# Patient Record
Sex: Female | Born: 1970 | Race: Black or African American | Hispanic: No | Marital: Married | Smoking: Never smoker
Health system: Southern US, Community
[De-identification: ages and names within clinical notes are randomized; demographics above are authoritative.]

## PROBLEM LIST (undated history)

## (undated) DIAGNOSIS — E0590 Thyrotoxicosis, unspecified without thyrotoxic crisis or storm: Secondary | ICD-10-CM

## (undated) HISTORY — DX: Thyrotoxicosis, unspecified without thyrotoxic crisis or storm: E05.90

---

## 1997-09-17 ENCOUNTER — Other Ambulatory Visit: Admission: RE | Admit: 1997-09-17 | Discharge: 1997-09-17 | Payer: Self-pay | Admitting: Nephrology

## 1998-01-06 ENCOUNTER — Other Ambulatory Visit: Admission: RE | Admit: 1998-01-06 | Discharge: 1998-01-06 | Payer: Self-pay | Admitting: Obstetrics and Gynecology

## 1998-05-14 ENCOUNTER — Other Ambulatory Visit: Admission: RE | Admit: 1998-05-14 | Discharge: 1998-05-14 | Payer: Self-pay | Admitting: Obstetrics and Gynecology

## 1999-09-07 ENCOUNTER — Other Ambulatory Visit: Admission: RE | Admit: 1999-09-07 | Discharge: 1999-09-07 | Payer: Self-pay | Admitting: Obstetrics and Gynecology

## 1999-12-08 ENCOUNTER — Inpatient Hospital Stay (HOSPITAL_COMMUNITY): Admission: AD | Admit: 1999-12-08 | Discharge: 1999-12-08 | Payer: Self-pay | Admitting: Obstetrics and Gynecology

## 1999-12-21 ENCOUNTER — Inpatient Hospital Stay (HOSPITAL_COMMUNITY): Admission: AD | Admit: 1999-12-21 | Discharge: 1999-12-22 | Payer: Self-pay | Admitting: Obstetrics & Gynecology

## 2000-06-13 ENCOUNTER — Encounter: Payer: Self-pay | Admitting: Obstetrics and Gynecology

## 2000-06-13 ENCOUNTER — Ambulatory Visit (HOSPITAL_COMMUNITY): Admission: RE | Admit: 2000-06-13 | Discharge: 2000-06-13 | Payer: Self-pay | Admitting: Obstetrics and Gynecology

## 2000-06-30 ENCOUNTER — Inpatient Hospital Stay (HOSPITAL_COMMUNITY): Admission: AD | Admit: 2000-06-30 | Discharge: 2000-07-03 | Payer: Self-pay | Admitting: Obstetrics and Gynecology

## 2000-07-06 ENCOUNTER — Observation Stay (HOSPITAL_COMMUNITY): Admission: AD | Admit: 2000-07-06 | Discharge: 2000-07-07 | Payer: Self-pay | Admitting: Obstetrics and Gynecology

## 2000-08-31 ENCOUNTER — Other Ambulatory Visit: Admission: RE | Admit: 2000-08-31 | Discharge: 2000-08-31 | Payer: Self-pay | Admitting: Obstetrics and Gynecology

## 2001-11-19 ENCOUNTER — Other Ambulatory Visit: Admission: RE | Admit: 2001-11-19 | Discharge: 2001-11-19 | Payer: Self-pay | Admitting: Obstetrics and Gynecology

## 2003-05-09 ENCOUNTER — Encounter: Admission: RE | Admit: 2003-05-09 | Discharge: 2003-05-09 | Payer: Self-pay | Admitting: Emergency Medicine

## 2003-08-29 ENCOUNTER — Emergency Department (HOSPITAL_COMMUNITY): Admission: EM | Admit: 2003-08-29 | Discharge: 2003-08-29 | Payer: Self-pay | Admitting: Emergency Medicine

## 2004-02-16 ENCOUNTER — Emergency Department (HOSPITAL_COMMUNITY): Admission: EM | Admit: 2004-02-16 | Discharge: 2004-02-16 | Payer: Self-pay | Admitting: Family Medicine

## 2005-03-31 ENCOUNTER — Emergency Department (HOSPITAL_COMMUNITY): Admission: EM | Admit: 2005-03-31 | Discharge: 2005-03-31 | Payer: Self-pay | Admitting: Family Medicine

## 2005-04-03 ENCOUNTER — Emergency Department (HOSPITAL_COMMUNITY): Admission: AD | Admit: 2005-04-03 | Discharge: 2005-04-03 | Payer: Self-pay | Admitting: Family Medicine

## 2005-04-05 ENCOUNTER — Emergency Department (HOSPITAL_COMMUNITY): Admission: EM | Admit: 2005-04-05 | Discharge: 2005-04-05 | Payer: Self-pay | Admitting: *Deleted

## 2005-09-08 ENCOUNTER — Emergency Department (HOSPITAL_COMMUNITY): Admission: EM | Admit: 2005-09-08 | Discharge: 2005-09-08 | Payer: Self-pay | Admitting: Emergency Medicine

## 2007-04-12 ENCOUNTER — Emergency Department (HOSPITAL_COMMUNITY): Admission: EM | Admit: 2007-04-12 | Discharge: 2007-04-12 | Payer: Self-pay | Admitting: Emergency Medicine

## 2008-03-24 ENCOUNTER — Emergency Department (HOSPITAL_COMMUNITY): Admission: EM | Admit: 2008-03-24 | Discharge: 2008-03-24 | Payer: Self-pay | Admitting: Family Medicine

## 2008-07-28 ENCOUNTER — Ambulatory Visit (HOSPITAL_COMMUNITY): Admission: RE | Admit: 2008-07-28 | Discharge: 2008-07-28 | Payer: Self-pay | Admitting: Geriatric Medicine

## 2009-04-11 ENCOUNTER — Emergency Department (HOSPITAL_COMMUNITY): Admission: EM | Admit: 2009-04-11 | Discharge: 2009-04-11 | Payer: Self-pay | Admitting: Emergency Medicine

## 2009-06-19 ENCOUNTER — Emergency Department (HOSPITAL_COMMUNITY): Admission: EM | Admit: 2009-06-19 | Discharge: 2009-06-19 | Payer: Self-pay | Admitting: Family Medicine

## 2009-10-01 ENCOUNTER — Emergency Department (HOSPITAL_COMMUNITY): Admission: EM | Admit: 2009-10-01 | Discharge: 2009-10-01 | Payer: Self-pay | Admitting: Family Medicine

## 2009-10-01 ENCOUNTER — Emergency Department (HOSPITAL_COMMUNITY): Admission: EM | Admit: 2009-10-01 | Discharge: 2009-10-02 | Payer: Self-pay | Admitting: Emergency Medicine

## 2009-10-01 ENCOUNTER — Ambulatory Visit: Payer: Self-pay | Admitting: Internal Medicine

## 2009-12-09 ENCOUNTER — Emergency Department (HOSPITAL_COMMUNITY): Admission: EM | Admit: 2009-12-09 | Discharge: 2009-12-09 | Payer: Self-pay | Admitting: Family Medicine

## 2010-04-13 LAB — POCT I-STAT, CHEM 8
BUN: 8 mg/dL (ref 6–23)
Calcium, Ion: 1.2 mmol/L (ref 1.12–1.32)
Chloride: 104 mEq/L (ref 96–112)
Creatinine, Ser: 0.7 mg/dL (ref 0.4–1.2)
Glucose, Bld: 87 mg/dL (ref 70–99)
HCT: 42 % (ref 36.0–46.0)
Hemoglobin: 14.3 g/dL (ref 12.0–15.0)
Potassium: 4.2 mEq/L (ref 3.5–5.1)
Sodium: 140 mEq/L (ref 135–145)
TCO2: 25 mmol/L (ref 0–100)

## 2010-04-13 LAB — TSH: TSH: 0.386 u[IU]/mL (ref 0.350–4.500)

## 2010-04-15 LAB — POCT CARDIAC MARKERS
CKMB, poc: <1 ng/mL — ABNORMAL LOW (ref 1.0–8.0)
CKMB, poc: <1 ng/mL — ABNORMAL LOW (ref 1.0–8.0)
CKMB, poc: <1 ng/mL — ABNORMAL LOW (ref 1.0–8.0)
Myoglobin, poc: 32.6 ng/mL (ref 12–200)
Myoglobin, poc: 45.3 ng/mL (ref 12–200)
Myoglobin, poc: 47.7 ng/mL (ref 12–200)
Troponin i, poc: <0.05 ng/mL (ref 0.00–0.09)
Troponin i, poc: <0.05 ng/mL (ref 0.00–0.09)
Troponin i, poc: <0.05 ng/mL (ref 0.00–0.09)

## 2010-04-15 LAB — URINALYSIS, ROUTINE W REFLEX MICROSCOPIC
Bilirubin Urine: NEGATIVE
Glucose, UA: NEGATIVE mg/dL
Ketones, ur: NEGATIVE mg/dL
Leukocytes, UA: NEGATIVE
Nitrite: NEGATIVE
Protein, ur: NEGATIVE mg/dL
Specific Gravity, Urine: 1.01 (ref 1.005–1.030)
Urobilinogen, UA: 0.2 mg/dL (ref 0.0–1.0)
pH: 6 (ref 5.0–8.0)

## 2010-04-15 LAB — CBC
HCT: 34.5 % — ABNORMAL LOW (ref 36.0–46.0)
Hemoglobin: 11.6 g/dL — ABNORMAL LOW (ref 12.0–15.0)
MCH: 29.1 pg (ref 26.0–34.0)
MCHC: 33.6 g/dL (ref 30.0–36.0)
MCV: 86.7 fL (ref 78.0–100.0)
Platelets: 205 10*3/uL (ref 150–400)
RBC: 3.98 MIL/uL (ref 3.87–5.11)
RDW: 13.5 % (ref 11.5–15.5)
WBC: 10.6 10*3/uL — ABNORMAL HIGH (ref 4.0–10.5)

## 2010-04-15 LAB — DIFFERENTIAL
Basophils Absolute: 0 10*3/uL (ref 0.0–0.1)
Basophils Relative: 0 % (ref 0–1)
Eosinophils Absolute: 0.2 10*3/uL (ref 0.0–0.7)
Eosinophils Relative: 2 % (ref 0–5)
Lymphocytes Relative: 29 % (ref 12–46)
Lymphs Abs: 3.1 10*3/uL (ref 0.7–4.0)
Monocytes Absolute: 0.7 10*3/uL (ref 0.1–1.0)
Monocytes Relative: 6 % (ref 3–12)
Neutro Abs: 6.7 10*3/uL (ref 1.7–7.7)
Neutrophils Relative %: 63 % (ref 43–77)

## 2010-04-15 LAB — POCT PREGNANCY, URINE: Preg Test, Ur: NEGATIVE

## 2010-04-15 LAB — URINE MICROSCOPIC-ADD ON

## 2010-04-15 LAB — BASIC METABOLIC PANEL
BUN: 7 mg/dL (ref 6–23)
CO2: 25 mEq/L (ref 19–32)
Calcium: 9.1 mg/dL (ref 8.4–10.5)
Chloride: 108 mEq/L (ref 96–112)
Creatinine, Ser: 0.61 mg/dL (ref 0.4–1.2)
GFR calc Af Amer: >60 mL/min (ref >60)
GFR calc non Af Amer: >60 mL/min (ref >60)
Glucose, Bld: 101 mg/dL — ABNORMAL HIGH (ref 70–99)
Potassium: 3.6 mEq/L (ref 3.5–5.1)
Sodium: 137 mEq/L (ref 135–145)

## 2010-04-25 LAB — POCT CARDIAC MARKERS
CKMB, poc: <1 ng/mL — ABNORMAL LOW (ref 1.0–8.0)
CKMB, poc: <1 ng/mL — ABNORMAL LOW (ref 1.0–8.0)
Myoglobin, poc: 38.9 ng/mL (ref 12–200)
Myoglobin, poc: 47.3 ng/mL (ref 12–200)
Troponin i, poc: <0.05 ng/mL (ref 0.00–0.09)
Troponin i, poc: <0.05 ng/mL (ref 0.00–0.09)

## 2010-04-25 LAB — POCT I-STAT, CHEM 8
BUN: 4 mg/dL — ABNORMAL LOW (ref 6–23)
Calcium, Ion: 1.2 mmol/L (ref 1.12–1.32)
Chloride: 105 mEq/L (ref 96–112)
Creatinine, Ser: 0.5 mg/dL (ref 0.4–1.2)
Glucose, Bld: 88 mg/dL (ref 70–99)
HCT: 37 % (ref 36.0–46.0)
Hemoglobin: 12.6 g/dL (ref 12.0–15.0)
Potassium: 3.7 mEq/L (ref 3.5–5.1)
Sodium: 140 mEq/L (ref 135–145)
TCO2: 28 mmol/L (ref 0–100)

## 2010-04-25 LAB — CBC
HCT: 35.3 % — ABNORMAL LOW (ref 36.0–46.0)
Hemoglobin: 12.1 g/dL (ref 12.0–15.0)
MCHC: 34.4 g/dL (ref 30.0–36.0)
MCV: 88.4 fL (ref 78.0–100.0)
Platelets: 223 10*3/uL (ref 150–400)
RBC: 4 MIL/uL (ref 3.87–5.11)
RDW: 13.6 % (ref 11.5–15.5)
WBC: 8.2 10*3/uL (ref 4.0–10.5)

## 2010-04-25 LAB — D-DIMER, QUANTITATIVE: D-Dimer, Quant: <0.22 ug/mL-FEU (ref 0.00–0.48)

## 2010-05-18 LAB — POCT URINALYSIS DIP (DEVICE)
Bilirubin Urine: NEGATIVE
Glucose, UA: NEGATIVE mg/dL
Ketones, ur: NEGATIVE mg/dL
Nitrite: NEGATIVE
Protein, ur: NEGATIVE mg/dL
Specific Gravity, Urine: 1.01 (ref 1.005–1.030)
Urobilinogen, UA: 0.2 mg/dL (ref 0.0–1.0)
pH: 7 (ref 5.0–8.0)

## 2010-05-18 LAB — POCT PREGNANCY, URINE: Preg Test, Ur: NEGATIVE

## 2010-06-18 NOTE — H&P (Signed)
[facility] of [facility]  Patient:    Emily Lane, Emily Lane                MRN: [id number] Adm. Date:  [id number] Attending:  Shaune Spittle Dictator:   Nigel Bridgeman, C.N.M.                         History and Physical  HISTORY OF PRESENT ILLNESS:   Emily Lane is a 40 year old gravida 3, para 2-0-1-2 at approximately five days postpartum, who presents with a severe headache over the last several days and blood pressure on a home monitor of 197/120.  The patient was brought to the maternity admissions unit where blood pressures were demonstrated to be in the 180s-190s systolic and in the 100-120s diastolic.  The decision was made to admit her for a 23-hour observation for initiation of Procardia and further monitoring.  The pregnancy was remarkable for: 1. Positive group B strep.  2. History of postpartum hypertension with her last pregnancy.  3. History of asthma.  4. History of IUGR last pregnancy.  OBSTETRICAL HISTORY:          In January 1996, she had a spontaneous miscarriage at 11-1/2 weeks with a D&C.  In November 1996, she had a vaginal birth of a female infant, weight 4 pounds 9 ounces at [redacted] weeks gestation. She did have positive group B strep and postpartum hypertension in that pregnancy.  She was placed on Procardia postpartum and maintained on that for approximately one month.  She delivered on July 01, 2000, a viable female by the name of Emily Lane; she had no significant complications.  There was meconium in the fluid.  Her postpartum course was essentially unremarkable. Her hemoglobin was 9.6 on day #1 postpartum.  On the day of discharge her blood pressure was 150/98 and 140/90.  She was discharged home.  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 She has occasional yeast infections.  She is a previous OCP user.  She  has varicosities.  She was diagnosed with asthma at birth and is followed by the [facility] and Dr. Zachery Dakins.  She uses Flovent, Proventil and albuterol p.r.n.  SURGICAL HISTORY:             D&C in 1996, colposcopy in 1999, vulvar biopsy in 1993 and wisdom teeth removal.  The only other hospitalization was for childbirth x 2.  FAMILY HISTORY:               Her family history is remarkable for hypertension among her maternal side of the family including her uncles and her maternal grandfather.  Her mother has also recently been diagnosed with hypertension.  Her father is also hypertensive and her maternal grandmother. There is also a family history of diabetes with her father being diet controlled, her maternal grandfather being an insulin-dependent diabetic and her maternal uncle having insulin-dependent diabetes.  She has a maternal first cousin who has childhood kidney disease which now makes that individual small for age.  GENETIC HISTORY:              Remarkable for father of the babys sister born with some type of colon problem and did not grow as much as she grew.  There is also a strong family history of  asthma.  SOCIAL HISTORY:               The patient is married to the father of the baby.  He is involved and supportive.  His name is Shamaya Kauer.  She is African-American and of the [address] faith.  Mr. Kamiya is also the father of her other child.  ALLERGIES:                    The patient is allergic to PENICILLIN which is a childhood allergy of uncertain presentation and she is also allergic to SEAFOOD, but is not allergic to iodine.  PHYSICAL EXAMINATION:  VITAL SIGNS:                  Blood pressures tonight are in the 180s-190s diastolic and 100-120 systolic.  HEENT:                        Within normal limits, although she does have a significant headache.  HEART:                        Regular rate and rhythm without murmur.  BREASTS:                       Soft and nontender.  ABDOMEN:                      Soft and nontender, negative CVA tenderness noted.  EXTREMITIES:                  Deep tendon reflexes are 3+ with 1-2 beats of clonus bilaterally.  PELVIC EXAM:                  Deferred.  There is a small amount of lochia noted.  DIAGNOSTIC STUDIES:           Clean-catch urine shows negative protein.  A CBC is within normal limits.  Comprehensive metabolic shows slight elevation of SGOT and SPGT in the 50s.  Other PIH labs are normal.  IMPRESSION:                   1. Status post vaginal delivery five days ago.                               2. Hypertension, questionably chronic.                               3. No evidence of pulmonary edema.  PLAN:                         1. Admit the third floor of [facility]                                  [facility] for consult with Dr. Dierdre Forth as attending physician.  2. Procardia 20 mg one p.o. now and then repeat                                  20 mg q.8h.                               3. Lasix 40 mg one p.o. now.                               4. Close observation of blood pressures.                               5. Sodium-restricted diet.                               6. Repeat comprehensive metabolic panel in the                                  a.m.                               7. Strict I and O and daily weights.                               8. Staff to notify Dr. Pennie Rushing if systolic                                  greater than 170 and diastolic greater than                                  105. DD:  07/07/00 TD:  07/07/00 Job: [id number]

## 2010-06-18 NOTE — Discharge Summary (Signed)
[facility] of [facility]  Patient:    Emily Lane, Emily Lane                MRN: [id number] Adm. Date:  [id number] Disc. Date: [id number] Attending:  Shaune Spittle Dictator:   Vance Gather Duplantis, C.N.M.                           Discharge Summary  ADMISSION DIAGNOSES:          1. Status post spontaneous vaginal birth on July 01, 2000.                               2. Hypertension.  DISCHARGE DIAGNOSES:          1. Status post spontaneous vaginal birth on July 01, 2000.                               2. Resolved hypertension.                               3. Bottle feeding.  PROCEDURE:                    None.  HOSPITAL COURSE:              Ms. Emily Lane is a 40 year old married black female gravida 3, para 2-0-1-2 five days status post spontaneous vaginal delivery who presented complaining of increasing headaches and blood pressures of 197/120 at home on an automated blood pressure machine.  She had no shortness of breath, no epigastric pain, no visual disturbances noted.  She had not had preeclampsia with her pregnancy or delivery.  She is bottle feeding without difficulty.  She had blood pressures that were indeed 177-192/103-120 on admission.  Her other vital signs are stable and she is afebrile.  Her clean catch UA was negative for protein and otherwise also negative.  Her SGOT was 53, SGPT 58.  Other laboratories were essentially within normal range.  Her platelets were 221,000.  She was admitted for observation and started on Procardia 20 mg p.o. q.8h.  She received one dose of Lasix p.o. and has had good diuresis since then and her blood pressures have all been within normal range subsequently.  Her blood pressures now are 64-71 with systolics of 121-131.  Her physical examination is also within normal limits.  SGOT 46, SGPT now 56.  Extremities are within normal  limits. She is deemed ready for discharge today.  DISCHARGE INSTRUCTIONS:       As per the [facility] OB/GYN handout. Call for increasing headaches, shortness of breath, or other signs or symptoms of problems.  DISCHARGE MEDICATIONS:        1. Procardia 20 mg p.o. q.8h. to continue until  further notice.                               2. Continue on Motrin p.r.n. pain.  DISCHARGE LABORATORIES:       SGOT 46, SGPT 56 down from admission values.  DISCHARGE FOLLOW-UP:          She is to return on Monday to the office for repeat blood pressure check. DD:  07/07/00 TD:  07/07/00 Job: [id number]

## 2010-06-18 NOTE — H&P (Signed)
[facility]  Patient:    Emily Lane, Emily Lane                         MRN: [id number] Attending:  Cecilio Asper, M.D. Dictator:   Wynelle Bourgeois, CNM                         History and Physical  HISTORY OF PRESENT ILLNESS:   Emily Lane is a 40 year old G2, para 1-0-0-1 at 11-6/7th weeks, who presents from the office with complaints of hyperemesis and inability to keep food or fluids down despite Phenergan administration. She has reported ketonuria from the office and is presenting for IV fluid hydration.  Pregnancy has been followed at [facility] since early first trimester and has been remarkable for:  1. Hyperemesis. 2. Asthma. 3. History of positive GBS. 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 5. First trimester bleeding secondary to moderate subchorionic hemorrhage. 6. IUGR. 7. History of PIH.  PRENATAL LABORATORIES:        Unavailable.  OBSTETRICAL HISTORY:          Remarkable for a spontaneous abortion in January 1996 at 11-1/[redacted] weeks gestation followed by a D&C with no complications.  She had a spontaneous vaginal delivery in November 1996 of a female infant at [redacted] weeks gestation weighing 4 pounds 9 ounces with complications of positive group B strep and hypertension, pregnancy-induced hypertension.  MEDICAL HISTORY:              Remarkable for asthma, abnormal Pap and HPV.  SURGICAL HISTORY:             Remarkable for a D&C in 1996, a colposcopy in 1999, vulvar biopsy in 1993 and wisdom tooth extraction as a teen.  FAMILY HISTORY:               Remarkable for maternal grandfather with an MI, hypertension in her maternal grandmother and father and several other second degree relatives.  Her father has history of insulin-dependent diabetes, as well as, a maternal uncle and maternal first cousin with kidney disease and there is also a strong family history for asthma.  GENETIC HISTORY:              Remarkable for a father of the  babys sister born with colon problems and a family history of asthma.  SOCIAL HISTORY:               Patient is married to [employer], who is involved and supportive.  She is of the [employer].  Denies any alcohol, tobacco or drug use.  PHYSICAL EXAMINATION:  VITAL SIGNS:                  Temperature 101, pulse 116, respiratory rate 18,                               blood pressure 133/85.  HEENT:                        Within normal limits.  CHEST:                        Clear to auscultation bilaterally.  Breasts  soft, nontender and no masses.  ABDOMEN:                      Gravid at 12 weeks size.  Nontender, no masses.  NECK:                         Supple.  HEART:                        Regular rate and rhythm.  BACK:                         Normal.  PELVIC EXAMINATION:           Deferred with extremities within normal limits. Skin warm and dry.  Laboratory values obtained in the office revealed large ketones in her urine. Fetal heart tones positive in 170s.  ASSESSMENT:                   1. Intrauterine pregnancy at 11-6/7th weeks.                               2. Hyperemesis.  PLAN:                         1. Admit for 23-hour observation.                               2. IV hydration overnight.                               3. Laboratories to include CMET, CBC.                               4. Phenergan 25 mg IV p.r.n. q.6h.                               5. Reassessment to be made in the morning with                                  ______ plans per M.D. DD:  12/20/99 TD:  12/20/99 Job: [id number]

## 2010-06-18 NOTE — Discharge Summary (Signed)
[facility]  Patient:    Emily Lane, Emily Lane                       MRN: [id number] Adm. Date:  [id number] Disc. Date: [id number] Attending:  Cleatrice Burke Dictator:   Nigel Bridgeman, C.N.M.                           Discharge Summary  ADMITTING DIAGNOSES:          1. Intrauterine pregnancy at 11 6/7 weeks.                               2. Hyperemesis.  DISCHARGE DIAGNOSES:          1. Intrauterine pregnancy at 11-6/7 weeks.                               2. Hyperemesis.  PROCEDURE:                    1. Intravenous hydration.                               2. Phenergan intravenously.  HOSPITAL COURSE:              Ms. [name] is a 40 year old gravida 2, para 1-0-0-1 at 11-6/7 weeks who was admitted on November 19, 1999, with hyperemesis. She has had weight loss and ketonuria.  Pregnancy has been followed at [facility] and has been remarkable for:  (1) hyperemesis, (2) history of asthma, (3) history of positive GBS, (4) history of HPV with high grade SIL, (5) first trimester bleeding, (6) history of IUGR, (7) history of PIH.  Patients pregnancy had been remarkable for two previous MAU visits requiring IV fluids.  Patient has been on Phenergan p.o. at home that has become minimally effective.  Patient was admitted for 23-hour observation, IV hydration, CMET and CBC which were normal, and Phenergan IV q.6h. p.r.n. Patient continued through the next day to have some vomiting.  Reglan IV was begun.  A decision was made to try to proceed with Reglan pump initiation. This was investigated through case management and [employer].  As of December 22, 1999 [facility] denied coverage of Reglan pump. Consultation was held with Dr. Kathryne Sharper as todays on-call physician.  A decision was made per the patients request to try Reglan therapy p.o. Patient did tolerate a bland lunch.  The decision was made to initiate Reglan p.o. therapy  and then to discharge the patient after dinner.  Fetal heart rate was noted to be in the 160s.  Patient was deemed to have received full benefit from her hospital stay and was discharged home.  DISCHARGE INSTRUCTIONS:       Per hyperemesis guidelines and suggestions.  DISCHARGE MEDICATIONS:        Reglan 10 mg one p.o. q.6h. p.r.n. nausea.  DISCHARGE FOLLOW-UP:          December 28, 1999 at [facility] with a return OB visit.  Patient will also call with any recurrence of the hyperemesis. DD:  12/22/99 TD:  12/24/99 Job: [id number]

## 2010-06-18 NOTE — H&P (Signed)
[facility]  Patient:    Emily Lane, Emily Lane                MRN: [id number] Adm. Date:  [id number] Attending:  Shaune Spittle Dictator:   Nigel Bridgeman, C.N.M.                         History and Physical  REDICTATION  HISTORY OF PRESENT ILLNESS:   Ms. Klier is a 40 year old gravida 3, para 1-0-1-1 at 39-5/7 weeks, who presents with uterine contractions every 5 to 10 minutes since 6:30 p.m.  She denies any leaking or bleeding and reports positive fetal movement.  Pregnancy has been remarkable for: #1 - Positive group B strep, #2 - history of postpartum PIH with her last pregnancy, #3 - history of asthma, #4 - history of IUGR last pregnancy.  PRENATAL LABORATORY DATA:     Blood type is A-positive.  Rh antibody negative. VDRL nonreactive.  Rubella titer positive.  Hepatitis B surface antigen negative.  GC and Chlamydia cultures were negative.  Pap was normal.  HIV was negative.  Sickle cell was negative.  AFP was normal.  Glucose challenge was normal.  Group B strep culture was positive with a previous pregnancy.  EDC of July 02, 2000 was established by last menstrual period and was in agreement with ultrasound at approximately 9 weeks.  Hemoglobin upon entry into practice was 11.7; it was 11 at 29 weeks.  HISTORY OF PRESENT PREGNANCY:  Patient entered care at approximately 10 weeks. She had some first trimester bleeding; she also had some nausea and vomiting in the first trimester for which she received IV fluids.  She had an ultrasound at 9 weeks which showed a moderate subchorionic hemorrhage.  She still had some frequent vomiting during the early part of her pregnancy.  She returned to work at approximately 17 to 18 weeks.  She had the Reglan pump that did help.  She had an ultrasound at 29 weeks which was within normal limits.  The rest of her pregnancy was uncomplicated.  OBSTETRICAL HISTORY:          In 1996, she had a  spontaneous miscarriage at 11-1/2 weeks with a D&C.  In 1996, she also had a vaginal birth of female infant, weight 4 pounds 9 ounces, at 39 weeks.  She was in labor six hours. She had Stadol.  She had positive group B strep.  She had hypertension which began soon after delivery and was treated for approximately one month with Procardia.  MEDICAL HISTORY:              Patient was on oral contraceptives until 1996; she then stopped.  She then used condoms.  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 She has occasional yeast infections.  She reports the usual childhood illnesses.  She has some superficial varicosities.  Patient was diagnosed with asthma at birth.  She is followed by [facility] and Dr. Anselm Pancoast. [place]. She uses Flonase and albuterol and Proventil on a p.r.n. basis.  Her only other hospitalization was for childbirth.  SURGICAL HISTORY:             Surgical history includes a D&C in 1996, colposcopy in 1999, wisdom teeth removed in the past and a vulvar biopsy in 1993.  ALLERGIES:  She is allergic to questionable PENICILLIN, which was a childhood reaction, and SEAFOOD.  She is not allergic to iodine preparations.  FAMILY HISTORY:               Her maternal uncle was stillborn.  Maternal grandmother had a stillborn.  Maternal grandfather had an MI.  Father is hypertensive, on medication.  Her maternal grandmother is on medication.  She has two maternal uncles with hypertension and a maternal grandfather with hypertension.  Her mother also has a recent new diagnosis of hypertension. Her sister and mother and maternal grandmother all have varicosities.  Her father is a diet-controlled diabetic.  Her maternal grandfather is an insulin-dependent diabetic.  Her maternal uncle is an insulin-dependent diabetic.  Paternal first cousin had childhood kidney disease.  She has family members who  smoke.  GENETIC HISTORY:              Genetic history is remarkable for the father of the babys sister born with a colon problem which did not grow as she grew. There is also a strong family history of asthma.  SOCIAL HISTORY:               Patient is married to the father of the baby. He is involved and supportive.  His name is Wiletta Bermingham.  She is African-American and of the [address] faith.  She has a high school education. She is employed as a Actuary. Research scientist (physical sciences); her husband is also employed with the [employer].  She has been followed by the physicians service at [facility].  She denies any alcohol, drug or tobacco use during this pregnancy.  PHYSICAL EXAMINATION:  VITAL SIGNS:                  Blood pressures are 140/90, 150/93, 144/90; other vital signs are stable.  HEENT:                        Within normal limits.  LUNGS:                        Bilateral breath sounds are clear.  HEART:                        Regular rate and rhythm without murmur.  BREASTS:                      Soft and nontender.  ABDOMEN:                      Fundal height is approximately 37 cm.  Estimated fetal weight is 6 pounds.  Uterine contractions show every five minutes with irritability between.  Fetal heart rate shows a baseline of 160 to 170 with variables to 130s, mild in nature.  PELVIC:                       Cervical exam 4 to 5 cm, 100%, vertex at -1 station with an intact bag of water.  EXTREMITIES:                  Deep tendon reflexes are 2+ without clonus. There is no edema noted.  IMPRESSION:                   1. Intrauterine pregnancy at 39-5/7 weeks.  2. Mild elevation of blood pressure.                               3. Early active labor.                               4. History of positive group B streptococcus.                               5. History of postpartum pregnancy-induced                                   hypertension with her last pregnancy.  PLAN:                         1. Admit to birthing suite per consult with                                   Dr. Janine Limbo as attending                                  physician.                               2. Routine physician orders.                               3. Plan group B strep prophylaxis with                                  clindamycin.                               4. Will do a clean-catch urine and PIH labs.                               5. Anticipate normal spontaneous vaginal birth. DD:  07/07/00 TD:  07/07/00 Job: [id number]

## 2010-08-30 ENCOUNTER — Emergency Department (INDEPENDENT_AMBULATORY_CARE_PROVIDER_SITE_OTHER): Payer: Federal, State, Local not specified - PPO

## 2010-08-30 ENCOUNTER — Emergency Department (HOSPITAL_BASED_OUTPATIENT_CLINIC_OR_DEPARTMENT_OTHER)
Admission: EM | Admit: 2010-08-30 | Discharge: 2010-08-30 | Disposition: A | Discharge status: Home / Self Care | Payer: Federal, State, Local not specified - PPO | Attending: Emergency Medicine | Admitting: Emergency Medicine

## 2010-08-30 ENCOUNTER — Encounter: Payer: Self-pay | Admitting: *Deleted

## 2010-08-30 DIAGNOSIS — R51 Headache: Secondary | ICD-10-CM | POA: Insufficient documentation

## 2010-08-30 DIAGNOSIS — G43909 Migraine, unspecified, not intractable, without status migrainosus: Secondary | ICD-10-CM

## 2010-08-30 DIAGNOSIS — J45909 Unspecified asthma, uncomplicated: Secondary | ICD-10-CM | POA: Insufficient documentation

## 2010-08-30 MED ORDER — SUMATRIPTAN SUCCINATE 100 MG PO TABS: 100.0000 mg | ORAL_TABLET | ORAL | Status: DC | PRN

## 2010-08-30 NOTE — ED Notes (Signed)
Pt c/o " migraine" x 1 week  

## 2010-08-30 NOTE — ED Provider Notes (Addendum)
History     Chief Complaint  Patient presents with  . Migraine   Patient is a 40 y.o. female presenting with migraine. The history is provided by the patient.  Migraine This is a recurrent problem. The current episode started more than 1 month ago. The problem occurs intermittently. The problem has been gradually worsening. Associated symptoms include headaches. The symptoms are aggravated by nothing. She has tried NSAIDs for the symptoms. The treatment provided moderate relief.  Migraine This is a recurrent problem. The current episode started more than 1 month ago. The problem occurs intermittently. The problem has been gradually worsening. Associated symptoms include headaches. The symptoms are aggravated by nothing. She has tried NSAIDs for the symptoms. The treatment provided moderate relief.  Pt reports frequent headaches for several months,  Left temporal area,   Past Medical History  Diagnosis Date  . Asthma   . Migraine     History reviewed. No pertinent past surgical history.  History reviewed. No pertinent family history.  History  Substance Use Topics  . Smoking status: Never Smoker   . Smokeless tobacco: Not on file  . Alcohol Use: No    OB History    Grav Para Term Preterm Abortions TAB SAB Ect Mult Living                  Review of Systems  Eyes: Negative for visual disturbance.  Neurological: Positive for headaches. Negative for dizziness, facial asymmetry and light-headedness.  All other systems reviewed and are negative.    Physical Exam  BP 135/74  Pulse 65  Temp 98.5 F (36.9 C)  Resp 20  Wt 164 lb (74.39 kg)  SpO2 100%  LMP 08/16/2010  Physical Exam  Constitutional: She is oriented to person, place, and time. She appears well-developed and well-nourished.  HENT:  Head: Normocephalic and atraumatic.  Right Ear: External ear normal.  Left Ear: External ear normal.  Mouth/Throat: Oropharynx is clear and moist.  Eyes: Conjunctivae and EOM  are normal. Pupils are equal, round, and reactive to light.  Neck: Normal range of motion. Neck supple.  Cardiovascular: Normal rate and regular rhythm.   Pulmonary/Chest: Effort normal and breath sounds normal.  Abdominal: Soft. Bowel sounds are normal.  Musculoskeletal: Normal range of motion.  Neurological: She is alert and oriented to person, place, and time. She has normal reflexes. No cranial nerve deficit. Coordination normal.  Skin: Skin is warm and dry.  Psychiatric: She has a normal mood and affect.    ED Course  Procedures  MDMpt started on imitrex,  Referred to neurology  Results for orders placed during the hospital encounter of 12/09/09  TSH      Component Value Range   TSH 0.386  0.350 - 4.500 (uIU/mL)  POCT I-STAT, CHEM 8      Component Value Range   Sodium 140  135 - 145 (mEq/L)   Potassium 4.2  3.5 - 5.1 (mEq/L)   Chloride 104  96 - 112 (mEq/L)   BUN 8  6 - 23 (mg/dL)   Creatinine, Ser 0.7  0.4 - 1.2 (mg/dL)   Glucose, Bld 87  70 - 99 (mg/dL)   Calcium, Ion 1.61  1.12 - 1.32 (mmol/L)   TCO2 25  0 - 100 (mmol/L)   Hemoglobin 14.3  12.0 - 15.0 (g/dL)   HCT 09.6  04.5 - 40.9 (%)   Ct Head Wo Contrast  08/30/2010  *RADIOLOGY REPORT*  Clinical Data: Left frontal headache  CT  HEAD WITHOUT CONTRAST  Technique:  Contiguous axial images were obtained from the base of the skull through the vertex without contrast.  Comparison: Head CT 05/09/2003  Findings: No acute intracranial hemorrhage.  No focal mass lesion. No CT evidence of acute infarction.  No midline shift or mass effect.  No hydrocephalus.  Paranasal sinuses and mastoid air cells are clear.  Orbits are normal.  IMPRESSION: Normal head CT.  Original Report Authenticated By: Genevive Bi, M.D.   Medical screening examination/treatment/procedure(s) were performed by non-physician practitioner and as supervising physician I was immediately available for consultation/collaboration. Osvaldo Human,  M.D.    Rea, [place] 08/30/10 1932  Carleene Cooper III, MD 08/31/10 1056  Carleene Cooper III, MD 10/08/10 [phone]

## 2010-08-30 NOTE — ED Notes (Signed)
Returned from xray

## 2010-10-25 LAB — I-STAT 8, (EC8 V) (CONVERTED LAB)
Acid-base deficit: 3 — ABNORMAL HIGH
BUN: <3 — ABNORMAL LOW
Bicarbonate: 22.6
Chloride: 108
Glucose, Bld: 70
HCT: 42
Hemoglobin: 14.3
Operator id: 285841
Potassium: 3.7
Sodium: 139
TCO2: 24
pCO2, Ven: 42.7 — ABNORMAL LOW
pH, Ven: 7.332 — ABNORMAL HIGH

## 2010-10-25 LAB — POCT CARDIAC MARKERS
CKMB, poc: <1 — ABNORMAL LOW
Myoglobin, poc: 43.7
Operator id: 285841
Troponin i, poc: <0.05

## 2010-10-25 LAB — POCT I-STAT CREATININE
Creatinine, Ser: 0.8
Operator id: 285841

## 2010-10-25 LAB — D-DIMER, QUANTITATIVE: D-Dimer, Quant: <0.22

## 2011-08-11 ENCOUNTER — Encounter (HOSPITAL_BASED_OUTPATIENT_CLINIC_OR_DEPARTMENT_OTHER): Payer: Self-pay

## 2011-08-11 ENCOUNTER — Emergency Department (HOSPITAL_BASED_OUTPATIENT_CLINIC_OR_DEPARTMENT_OTHER)
Admission: EM | Admit: 2011-08-11 | Discharge: 2011-08-11 | Disposition: A | Discharge status: Home / Self Care | Payer: Federal, State, Local not specified - PPO | Attending: Emergency Medicine | Admitting: Emergency Medicine

## 2011-08-11 ENCOUNTER — Emergency Department (HOSPITAL_BASED_OUTPATIENT_CLINIC_OR_DEPARTMENT_OTHER): Payer: Federal, State, Local not specified - PPO

## 2011-08-11 DIAGNOSIS — R079 Chest pain, unspecified: Secondary | ICD-10-CM

## 2011-08-11 DIAGNOSIS — R109 Unspecified abdominal pain: Secondary | ICD-10-CM

## 2011-08-11 DIAGNOSIS — J45909 Unspecified asthma, uncomplicated: Secondary | ICD-10-CM | POA: Insufficient documentation

## 2011-08-11 DIAGNOSIS — R1013 Epigastric pain: Secondary | ICD-10-CM | POA: Insufficient documentation

## 2011-08-11 LAB — COMPREHENSIVE METABOLIC PANEL
ALT: 10 U/L (ref 0–35)
AST: 14 U/L (ref 0–37)
Albumin: 3.8 g/dL (ref 3.5–5.2)
Alkaline Phosphatase: 56 U/L (ref 39–117)
BUN: 8 mg/dL (ref 6–23)
CO2: 26 mEq/L (ref 19–32)
Calcium: 9.1 mg/dL (ref 8.4–10.5)
Chloride: 101 mEq/L (ref 96–112)
Creatinine, Ser: 0.7 mg/dL (ref 0.50–1.10)
GFR calc Af Amer: >90 mL/min (ref >90)
GFR calc non Af Amer: >90 mL/min (ref >90)
Glucose, Bld: 89 mg/dL (ref 70–99)
Potassium: 3.8 mEq/L (ref 3.5–5.1)
Sodium: 137 mEq/L (ref 135–145)
Total Bilirubin: 0.3 mg/dL (ref 0.3–1.2)
Total Protein: 7.2 g/dL (ref 6.0–8.3)

## 2011-08-11 LAB — URINALYSIS, ROUTINE W REFLEX MICROSCOPIC
Bilirubin Urine: NEGATIVE
Glucose, UA: NEGATIVE mg/dL
Ketones, ur: 15 mg/dL — AB
Nitrite: NEGATIVE
Protein, ur: 30 mg/dL — AB
Specific Gravity, Urine: 1.027 (ref 1.005–1.030)
Urobilinogen, UA: 0.2 mg/dL (ref 0.0–1.0)
pH: 7.5 (ref 5.0–8.0)

## 2011-08-11 LAB — CBC WITH DIFFERENTIAL/PLATELET
Basophils Absolute: 0 10*3/uL (ref 0.0–0.1)
Basophils Relative: 0 % (ref 0–1)
Eosinophils Absolute: 0.3 10*3/uL (ref 0.0–0.7)
Eosinophils Relative: 3 % (ref 0–5)
HCT: 35.4 % — ABNORMAL LOW (ref 36.0–46.0)
Hemoglobin: 12.2 g/dL (ref 12.0–15.0)
Lymphocytes Relative: 29 % (ref 12–46)
Lymphs Abs: 3.3 10*3/uL (ref 0.7–4.0)
MCH: 29.5 pg (ref 26.0–34.0)
MCHC: 34.5 g/dL (ref 30.0–36.0)
MCV: 85.5 fL (ref 78.0–100.0)
Monocytes Absolute: 0.8 10*3/uL (ref 0.1–1.0)
Monocytes Relative: 7 % (ref 3–12)
Neutro Abs: 7.1 10*3/uL (ref 1.7–7.7)
Neutrophils Relative %: 62 % (ref 43–77)
Platelets: 236 10*3/uL (ref 150–400)
RBC: 4.14 MIL/uL (ref 3.87–5.11)
RDW: 12.8 % (ref 11.5–15.5)
WBC: 11.5 10*3/uL — ABNORMAL HIGH (ref 4.0–10.5)

## 2011-08-11 LAB — URINE MICROSCOPIC-ADD ON

## 2011-08-11 LAB — TROPONIN I: Troponin I: <0.3 ng/mL (ref <0.30)

## 2011-08-11 LAB — PREGNANCY, URINE: Preg Test, Ur: NEGATIVE

## 2011-08-11 LAB — LIPASE, BLOOD: Lipase: 18 U/L (ref 11–59)

## 2011-08-11 MED ORDER — HYDROCODONE-ACETAMINOPHEN 5-325 MG PO TABS: 1.0000 | ORAL_TABLET | Freq: Four times a day (QID) | ORAL | Status: AC | PRN

## 2011-08-11 MED ORDER — IOHEXOL 300 MG/ML  SOLN
20.0000 mL | Freq: Once | INTRAMUSCULAR | Status: AC | PRN
  Administered 2011-08-11: 20 mL via ORAL

## 2011-08-11 MED ORDER — IOHEXOL 300 MG/ML  SOLN
100.0000 mL | Freq: Once | INTRAMUSCULAR | Status: AC | PRN
  Administered 2011-08-11: 100 mL via INTRAVENOUS

## 2011-08-11 MED ORDER — SODIUM CHLORIDE 0.9 % IV BOLUS (SEPSIS)
250.0000 mL | Freq: Once | INTRAVENOUS | Status: AC
  Administered 2011-08-11: 250 mL via INTRAVENOUS

## 2011-08-11 MED ORDER — HYDROMORPHONE HCL PF 1 MG/ML IJ SOLN: 1.0000 mg | Freq: Once | INTRAMUSCULAR | Status: DC

## 2011-08-11 MED ORDER — ONDANSETRON HCL 4 MG/2ML IJ SOLN
4.0000 mg | Freq: Once | INTRAMUSCULAR | Status: AC
  Administered 2011-08-11: 4 mg via INTRAVENOUS
  Filled 2011-08-11: qty 2

## 2011-08-11 MED ORDER — SODIUM CHLORIDE 0.9 % IV SOLN: INTRAVENOUS | Status: DC

## 2011-08-11 NOTE — ED Provider Notes (Signed)
History     CSN: [id number]  Arrival date & time 08/11/11  1753   First MD Initiated Contact with Patient 08/11/11 1947      Chief Complaint  Patient presents with  . Abdominal Pain  . Chest Pain    (Consider location/radiation/quality/duration/timing/severity/associated sxs/prior treatment) The history is provided by the patient.   patient is a 41 year old female presenting with 2 different complaints. First complaint is chest pain started yesterday at around 4:00 in the afternoon his left-sided chest goes to left arm and up to the left part of the neck. The other complaint is abdominal pain which is periumbilical to epigastric area that has been present for 4-5 months no nausea no vomiting has not had either one worked up before no past history of any chest pain chest pain currently is a 5/10 abdominal pain is about a 5/10 as well the chest pain is sharp abdominal pain as an ache there is no lower quadrant or pelvic abdominal pain.  Past Medical History  Diagnosis Date  . Asthma   . Migraine     History reviewed. No pertinent past surgical history.  No family history on file.  History  Substance Use Topics  . Smoking status: Never Smoker   . Smokeless tobacco: Not on file  . Alcohol Use: No    OB History    Grav Para Term Preterm Abortions TAB SAB Ect Mult Living                  Review of Systems  Constitutional: Negative for fever and chills.  HENT: Negative for neck pain.   Eyes: Negative for redness.  Respiratory: Negative for shortness of breath.   Cardiovascular: Positive for chest pain. Negative for palpitations and leg swelling.  Gastrointestinal: Positive for abdominal pain. Negative for nausea, vomiting and diarrhea.  Genitourinary: Negative for dysuria, hematuria, vaginal bleeding and vaginal discharge.  Musculoskeletal: Negative for back pain.  Skin: Negative for rash.  Neurological: Negative for headaches.  Hematological: Does not bruise/bleed  easily.    Allergies  Shellfish allergy and Penicillins  Home Medications   Current Outpatient Rx  Name Route Sig Dispense Refill  . EPINEPHRINE 0.3 MG/0.3ML IJ DEVI Intramuscular Inject 0.3 mg into the muscle once.    . ALBUTEROL 90 MCG/ACT IN AERS Inhalation Inhale 2 puffs into the lungs as needed. Shortness of breath      . HYDROCODONE-ACETAMINOPHEN 5-325 MG PO TABS Oral Take 1-2 tablets by mouth every 6 (six) hours as needed for pain. 10 tablet 0    BP 121/81  Pulse 84  Temp 98.1 F (36.7 C) (Oral)  Resp 16  Ht 5\' 6"  (1.676 m)  Wt 172 lb (78.019 kg)  BMI 27.76 kg/m2  SpO2 100%  LMP 07/28/2011  Physical Exam  Nursing note and vitals reviewed. Constitutional: She is oriented to person, place, and time. She appears well-developed and well-nourished. No distress.  HENT:  Head: Normocephalic and atraumatic.  Mouth/Throat: Oropharynx is clear and moist.  Eyes: Conjunctivae and EOM are normal. Pupils are equal, round, and reactive to light.  Neck: Normal range of motion. Neck supple.  Cardiovascular: Normal rate, regular rhythm and normal heart sounds.   No murmur heard. Pulmonary/Chest: Effort normal and breath sounds normal.  Abdominal: Soft. Bowel sounds are normal. She exhibits no mass. There is no tenderness. There is no rebound.  Musculoskeletal: Normal range of motion.  Neurological: She is alert and oriented to person, place, and time. No cranial nerve  deficit. She exhibits normal muscle tone. Coordination normal.  Skin: Skin is warm. No rash noted.    ED Course  Procedures (including critical care time)  Labs Reviewed  URINALYSIS, ROUTINE W REFLEX MICROSCOPIC - Abnormal; Notable for the following:    APPearance CLOUDY (*)     Hgb urine dipstick TRACE (*)     Ketones, ur 15 (*)     Protein, ur 30 (*)     Leukocytes, UA SMALL (*)     All other components within normal limits  URINE MICROSCOPIC-ADD ON - Abnormal; Notable for the following:    Squamous  Epithelial / LPF FEW (*)     All other components within normal limits  CBC WITH DIFFERENTIAL - Abnormal; Notable for the following:    WBC 11.5 (*)     HCT 35.4 (*)     All other components within normal limits  PREGNANCY, URINE  COMPREHENSIVE METABOLIC PANEL  LIPASE, BLOOD  TROPONIN I   Dg Chest 2 View  08/11/2011  *RADIOLOGY REPORT*  Clinical Data: Chest pain  CHEST - 2 VIEW  Comparison: 10/01/2009  Findings: Normal heart size and clear lungs.  IMPRESSION: Negative.  Original Report Authenticated By: Donavan Burnet, M.D.   Ct Abdomen Pelvis W Contrast  08/11/2011  *RADIOLOGY REPORT*  Clinical Data: Abdominal pain.  CT ABDOMEN AND PELVIS WITH CONTRAST  Technique:  Multidetector CT imaging of the abdomen and pelvis was performed following the standard protocol during bolus administration of intravenous contrast.  Contrast: 20mL OMNIPAQUE IOHEXOL 300 MG/ML  SOLN, OMNIPAQUE IOHEXOL 300 MG/ML  SOLN  Comparison: 07/28/2008  Findings: Lung bases are clear.  There is no evidence for free air.  Normal appearance of liver, portal venous system, gallbladder, spleen, adrenal glands, pancreas and kidneys.  Mild fullness of the renal pelvises bilaterally. The appendix is slightly prominent and measures up to 10 mm in diameter.  This is similar to the prior examination, when the appendix measured roughly 8 mm in thickness. There are no definite inflammatory changes around the appendix.  There is a trace amount of fluid in the pelvis. There is a 2.8 cm low density structure involving the right adnexa which could represent a cyst.  There are fluid filled loops of small bowel in the pelvis.  No gross abnormality to the uterus or left adnexal structures.  Small amount of fluid within the endometrial cavity. No acute bony abnormality.  IMPRESSION: The appendix is slightly prominent but no evidence for inflammation around the appendix.  Appendix had a similar appearance in 2010.  2.8 cm low density structure  associated with the right adnexa. This probably represents an ovarian cyst.  If this is an area of concern, recommend a pelvic ultrasound.  Original Report Authenticated By: Richarda Overlie, M.D.     Date: 08/11/2011  Rate: 65  Rhythm: normal sinus rhythm and sinus arrhythmia  QRS Axis: normal  Intervals: normal  ST/T Wave abnormalities: normal  Conduction Disutrbances:none  Narrative Interpretation:   Old EKG Reviewed: none available  1. Chest pain   2. Abdominal pain       MDM     Patient chest pain with negative workup the EKG without acute changes troponins negative chest pain present since yesterday. Chest x-ray negative for pneumonia or pneumothorax. Abdominal pain workup a CT scan shows a 2.8 cm adnexal cyst probably not correlating to her pain since her pain is periumbilical and epigastric areas she will followup with her GYN doctor for followup  ultrasound. Patient's in no acute distress nontoxic no acute abdomen.     Shelda Jakes, MD 08/11/11 2206

## 2011-08-11 NOTE — ED Notes (Signed)
Pt reports several month history of abdominal pain, nausea, vomiting and developed left chest wall pain yesterday that is described as sore, sharp and tender to touch.

## 2011-11-14 ENCOUNTER — Encounter: Payer: Self-pay | Admitting: Endocrinology

## 2011-11-14 ENCOUNTER — Ambulatory Visit (INDEPENDENT_AMBULATORY_CARE_PROVIDER_SITE_OTHER): Payer: Federal, State, Local not specified - PPO | Admitting: Endocrinology

## 2011-11-14 VITALS — BP 118/78 | HR 75 | Temp 98.5°F | Wt 178.0 lb

## 2011-11-14 DIAGNOSIS — E0590 Thyrotoxicosis, unspecified without thyrotoxic crisis or storm: Secondary | ICD-10-CM

## 2011-11-14 NOTE — Progress Notes (Signed)
  Subjective:    Patient ID: Emily Lane, female    DOB: [birth year], 41 y.o.   MRN: [id number]  HPI Pt states 1 year of moderate hair loss on the head, and assoc fatigue. Past Medical History  Diagnosis Date  . Asthma   . Migraine     No past surgical history on file.  History   Social History  . Marital Status: Married    Spouse Name: N/A    Number of Children: N/A  . Years of Education: N/A   Occupational History  . Not on file.   Social History Main Topics  . Smoking status: Never Smoker   . Smokeless tobacco: Not on file  . Alcohol Use: No  . Drug Use: No  . Sexually Active: Yes    Birth Control/ Protection: None   Other Topics Concern  . Not on file   Social History Narrative  . No narrative on file    Current Outpatient Prescriptions on File Prior to Visit  Medication Sig Dispense Refill  . albuterol (PROVENTIL,VENTOLIN) 90 MCG/ACT inhaler Inhale 2 puffs into the lungs as needed. Shortness of breath        . EPINEPHrine (EPI-PEN) 0.3 mg/0.3 mL DEVI Inject 0.3 mg into the muscle once.       Allergies  Allergen Reactions  . Shellfish Allergy Anaphylaxis  . Penicillins Hives   No family history on file. No thyroid probs BP 118/78  Pulse 75  Temp 98.5 F (36.9 C) (Oral)  Wt 178 lb (80.74 kg)  Review of Systems She has dry skin, palpitations, irreg menses, and constipation.  denies weight loss, hoarseness, double vision, sob, diarrhea, polyuria, myalgias, excessive diaphoresis, numbness, tremor, anxiety, easy bruising, and rhinorrhea. She has chronic headache.    Objective:   Physical Exam VS: see vs page GEN: no distress HEAD: head: no deformity eyes: no periorbital swelling, no proptosis external nose and ears are normal mouth: no lesion seen NECK: supple, thyroid is not enlarged.  No nodule CHEST WALL: no deformity LUNGS:  Clear to auscultation CV: reg rate and rhythm, no murmur ABD: abdomen is soft, nontender.  no hepatosplenomegaly.  not  distended.  no hernia MUSCULOSKELETAL: muscle bulk and strength are grossly normal.  no obvious joint swelling.  gait is normal and steady EXTEMITIES: no deformity.  no ulcer on the feet.  feet are of normal color and temp.  no edema PULSES: dorsalis pedis intact bilat.  no carotid bruit NEURO:  cn 2-12 grossly intact.   readily moves all 4's.  sensation is intact to touch on all 4's.  No tremor SKIN:  Normal texture and temperature.  No rash or suspicious lesion is visible.   NODES:  None palpable at the neck PSYCH: alert, oriented x3.  Does not appear anxious nor depressed.  outside test results are reviewed: TSH=0.1    Assessment & Plan:  Hyperthyroidism, new.  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 Hair loss, uncertain if thyroid-related Headache, not thyroid-related

## 2011-11-14 NOTE — Patient Instructions (Addendum)
let's check an ultrasound and a thyroid "scan" (a special, but easy and painless type of thyroid x ray).  It works like this: you go to the x-ray department of the hospital to swallow a pill, which contains a miniscule amount of radiation.  You will not notice any symptoms from this.  You will go back to the x-ray department the next day, to lie down in front of a camera.  The results of this will be sent to me.   Based on the results, i hope to order for you a treatment pill of radioactive iodine.  Although it is a larger amount of radiation, you will again notice no symptoms from this.  The pill is gone from your body in a few days (during which you should stay away from other people), but takes several months to work.  Therefore, please return here approximately 6-8 weeks after the treatment.  This treatment has been available for many years, and the only known side-effect is an underactive thyroid.  It is possible that i would eventually prescribe for you a thyroid hormone pill, which is very inexpensive.  You don't have to worry about side-effects of this thyroid hormone pill, because it is the same molecule your thyroid makes. In view of your medical condition, you should avoid pregnancy until we have decided it is safe

## 2011-11-22 ENCOUNTER — Encounter (HOSPITAL_COMMUNITY)
Admission: RE | Admit: 2011-11-22 | Discharge: 2011-11-22 | Disposition: A | Discharge status: Home / Self Care | Payer: Federal, State, Local not specified - PPO | Source: Ambulatory Visit | Attending: Endocrinology | Admitting: Endocrinology

## 2011-11-22 ENCOUNTER — Ambulatory Visit (HOSPITAL_COMMUNITY)
Admission: RE | Admit: 2011-11-22 | Discharge: 2011-11-22 | Disposition: A | Discharge status: Home / Self Care | Payer: Federal, State, Local not specified - PPO | Source: Ambulatory Visit | Attending: Endocrinology | Admitting: Endocrinology

## 2011-11-22 DIAGNOSIS — E0590 Thyrotoxicosis, unspecified without thyrotoxic crisis or storm: Secondary | ICD-10-CM

## 2011-11-22 DIAGNOSIS — E049 Nontoxic goiter, unspecified: Secondary | ICD-10-CM | POA: Insufficient documentation

## 2011-11-22 MED ORDER — SODIUM IODIDE I 131 CAPSULE
9.4500 | Freq: Once | INTRAVENOUS | Status: AC | PRN
  Administered 2011-11-22: 9.45 via ORAL

## 2011-11-23 ENCOUNTER — Encounter (HOSPITAL_COMMUNITY)
Admission: RE | Admit: 2011-11-23 | Discharge: 2011-11-23 | Disposition: A | Discharge status: Home / Self Care | Payer: Federal, State, Local not specified - PPO | Source: Ambulatory Visit | Attending: Endocrinology | Admitting: Endocrinology

## 2011-11-23 DIAGNOSIS — E042 Nontoxic multinodular goiter: Secondary | ICD-10-CM | POA: Insufficient documentation

## 2011-11-23 DIAGNOSIS — E0590 Thyrotoxicosis, unspecified without thyrotoxic crisis or storm: Secondary | ICD-10-CM | POA: Insufficient documentation

## 2011-11-23 MED ORDER — SODIUM PERTECHNETATE TC 99M INJECTION
10.0000 | Freq: Once | INTRAVENOUS | Status: AC | PRN
  Administered 2011-11-23: 10 via INTRAVENOUS

## 2011-11-24 ENCOUNTER — Other Ambulatory Visit: Payer: Self-pay | Admitting: Endocrinology

## 2011-11-24 DIAGNOSIS — E0590 Thyrotoxicosis, unspecified without thyrotoxic crisis or storm: Secondary | ICD-10-CM

## 2011-11-28 ENCOUNTER — Ambulatory Visit: Payer: Federal, State, Local not specified - PPO | Admitting: Endocrinology

## 2011-11-28 DIAGNOSIS — Z0289 Encounter for other administrative examinations: Secondary | ICD-10-CM

## 2011-12-15 ENCOUNTER — Telehealth: Payer: Self-pay

## 2011-12-15 NOTE — Telephone Encounter (Signed)
Pt states she never received results of test she had done in October, please advise

## 2011-12-15 NOTE — Telephone Encounter (Signed)
Patient notified of test results. Patient undecided and has questions concerning this report. Will call and get appt. To discuss with Dr. Everardo All.

## 2011-12-15 NOTE — Telephone Encounter (Signed)
The [place] and nuc med scan showed that the radioactive iodine treatment is possible.  Our office will call you to schedule, if you agree.  i would if i was you.

## 2012-02-27 ENCOUNTER — Telehealth: Payer: Self-pay | Admitting: *Deleted

## 2012-02-27 NOTE — Telephone Encounter (Signed)
i called pt As it has been several mos, please redo labs, and ask that a copy be sent here. If tsh is significantly low again, you should do i-131 rx.  Tapazole is 2nd best option

## 2012-02-27 NOTE — Telephone Encounter (Signed)
Left msg on triage requesting iodine & u/s sound results....Emily Lane

## 2012-03-02 ENCOUNTER — Telehealth: Payer: Self-pay | Admitting: Endocrinology

## 2012-03-02 NOTE — Telephone Encounter (Signed)
Forward  pages from [facility] to Dr. Romero Belling for review on 03-02-12 ym

## 2012-03-02 NOTE — Telephone Encounter (Signed)
Forward  9 pages from [facility] to Dr. Romero Belling for review on 03-02-12 ym

## 2012-03-05 ENCOUNTER — Telehealth: Payer: Self-pay | Admitting: *Deleted

## 2012-03-05 NOTE — Telephone Encounter (Signed)
[facility] , [place], NOTIFIED OF NEED FOR PATIENT TO MAKE APPT. WITH DR. Everardo All

## 2012-03-08 ENCOUNTER — Telehealth: Payer: Self-pay | Admitting: Endocrinology

## 2012-03-08 NOTE — Telephone Encounter (Signed)
CALLED AND LEFT MESSAGE WITH PATIENT SPOUSE OF NEED FOR Najae TO CALL OUR OFFICE TO SCHEDULE APPOINTMENT WITH DR. Everardo All. SPOUSE , FERNANDO, STATED WOULD GIVE HER THE MESSAGE.

## 2012-03-08 NOTE — Telephone Encounter (Signed)
Left message on 03/06/12 and 03/08/12 to schedule patient for appointment per referral from [facility].  No return call as of 2:20pm on 03/08/12. Will continue to try to reach patient.

## 2012-03-09 NOTE — Telephone Encounter (Signed)
The patient returned call and appointment was scheduled for 03/14/12 at 4pm. FYI message.

## 2012-03-13 ENCOUNTER — Ambulatory Visit: Payer: Federal, State, Local not specified - PPO | Admitting: Endocrinology

## 2012-03-14 ENCOUNTER — Ambulatory Visit (INDEPENDENT_AMBULATORY_CARE_PROVIDER_SITE_OTHER): Payer: Federal, State, Local not specified - PPO | Admitting: Endocrinology

## 2012-03-14 ENCOUNTER — Encounter: Payer: Self-pay | Admitting: Endocrinology

## 2012-03-14 VITALS — BP 124/74 | HR 78 | Wt 163.0 lb

## 2012-03-14 DIAGNOSIS — E0590 Thyrotoxicosis, unspecified without thyrotoxic crisis or storm: Secondary | ICD-10-CM

## 2012-03-14 MED ORDER — METHIMAZOLE 10 MG PO TABS: 10.0000 mg | ORAL_TABLET | Freq: Every day | ORAL | Status: DC

## 2012-03-14 NOTE — Patient Instructions (Addendum)
i have sent a prescription to your pharmacy, to slow down the thyroid. if ever you have fever while taking methimazole, stop it and call us, because of the risk of a rare side-effect. Please come back for a follow-up appointment for 1 month.   In view of your medical condition, you should avoid pregnancy until we have decided it is safe

## 2012-03-14 NOTE — Progress Notes (Signed)
  Subjective:    Patient ID: Emily Lane, female    DOB: [birth year], 42 y.o.   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  She had TFT redone at dr parachuri's office last month, and she was still hyperthyroid.  She still has fatigue and hair loss.   Past Medical History  Diagnosis Date  . Migraine   . Asthma     No past surgical history on file.  History   Social History  . Marital Status: Married    Spouse Name: N/A    Number of Children: N/A  . Years of Education: N/A   Occupational History  . Not on file.   Social History Main Topics  . Smoking status: Never Smoker   . Smokeless tobacco: Not on file  . Alcohol Use: No  . Drug Use: No  . Sexually Active: Yes    Birth Control/ Protection: None   Other Topics Concern  . Not on file   Social History Narrative  . No narrative on file    Current Outpatient Prescriptions on File Prior to Visit  Medication Sig Dispense Refill  . albuterol (PROVENTIL,VENTOLIN) 90 MCG/ACT inhaler Inhale 2 puffs into the lungs as needed. Shortness of breath        . EPINEPHrine (EPI-PEN) 0.3 mg/0.3 mL DEVI Inject 0.3 mg into the muscle once.       No current facility-administered medications on file prior to visit.    Allergies  Allergen Reactions  . Shellfish Allergy Anaphylaxis  . Penicillins Hives    No family history on file.  BP 124/74  Pulse 78  Wt 163 lb (73.936 kg)  BMI 26.32 kg/m2  SpO2 99%  Review of Systems She has lost weight, due to her dietary efforts.      Objective:   Physical Exam VITAL SIGNS:  See vs page GENERAL: no distress NECK: There is no palpable thyroid enlargement.  No thyroid nodule is palpable.  No palpable lymphadenopathy at the anterior neck.     Assessment & Plan:  Hyperthyroidism: we discussed rx options.  Pt says she wants to take tapazole for now, and consider i-131 at a later date.

## 2012-04-12 ENCOUNTER — Ambulatory Visit (INDEPENDENT_AMBULATORY_CARE_PROVIDER_SITE_OTHER): Payer: Federal, State, Local not specified - PPO | Admitting: Endocrinology

## 2012-04-12 ENCOUNTER — Encounter: Payer: Self-pay | Admitting: Endocrinology

## 2012-04-12 ENCOUNTER — Telehealth: Payer: Self-pay | Admitting: Endocrinology

## 2012-04-12 VITALS — BP 122/70 | HR 64 | Wt 159.0 lb

## 2012-04-12 DIAGNOSIS — R519 Headache, unspecified: Secondary | ICD-10-CM | POA: Insufficient documentation

## 2012-04-12 DIAGNOSIS — E0590 Thyrotoxicosis, unspecified without thyrotoxic crisis or storm: Secondary | ICD-10-CM

## 2012-04-12 DIAGNOSIS — R51 Headache: Secondary | ICD-10-CM

## 2012-04-12 LAB — TSH: TSH: 0.05 u[IU]/mL — ABNORMAL LOW (ref 0.35–5.50)

## 2012-04-12 LAB — T4, FREE: Free T4: 0.83 ng/dL (ref 0.60–1.60)

## 2012-04-12 NOTE — Telephone Encounter (Signed)
i did referral 

## 2012-04-12 NOTE — Progress Notes (Signed)
  Subjective:    Patient ID: Emily Lane, female    DOB: 06-Jun-1970, 42 y.o.   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  Since on the tapazole, she still has fatigue.   Past Medical History  Diagnosis Date  . Migraine   . Asthma     No past surgical history on file.  History   Social History  . Marital Status: Married    Spouse Name: N/A    Number of Children: N/A  . Years of Education: N/A   Occupational History  . Not on file.   Social History Main Topics  . Smoking status: Never Smoker   . Smokeless tobacco: Not on file  . Alcohol Use: No  . Drug Use: No  . Sexually Active: Yes    Birth Control/ Protection: None   Other Topics Concern  . Not on file   Social History Narrative  . No narrative on file    Current Outpatient Prescriptions on File Prior to Visit  Medication Sig Dispense Refill  . albuterol (PROVENTIL,VENTOLIN) 90 MCG/ACT inhaler Inhale 2 puffs into the lungs as needed. Shortness of breath        . EPINEPHrine (EPI-PEN) 0.3 mg/0.3 mL DEVI Inject 0.3 mg into the muscle once.      . methimazole (TAPAZOLE) 10 MG tablet Take 1 tablet (10 mg total) by mouth daily.  30 tablet  2   No current facility-administered medications on file prior to visit.    Allergies  Allergen Reactions  . Shellfish Allergy Anaphylaxis  . Penicillins Hives    No family history on file.  BP 122/70  Pulse 64  Wt 159 lb (72.122 kg)  BMI 25.68 kg/m2  SpO2 98%  Review of Systems Denies fever    Objective:   Physical Exam VITAL SIGNS:  See vs page GENERAL: no distress Skin: not diaphoretic Neuro: no tremor     Assessment & Plan:  Hyperthyroidism, on rx

## 2012-04-12 NOTE — Patient Instructions (Addendum)
blood tests are being requested for you today.  We'll contact you with results. if ever you have fever while taking methimazole, stop it and call us, because of the risk of a rare side-effect Please come back for a follow-up appointment in 6 weeks.  In view of your medical condition, you should avoid pregnancy until we have decided it is safe  

## 2012-04-12 NOTE — Telephone Encounter (Signed)
The patient came to check out and stated that she was told to make appt with Neurology- Dr. Smiley Houseman and Dr. Arbutus Leas need a referral and then they can schedule.  Please advise.

## 2012-04-27 ENCOUNTER — Ambulatory Visit: Payer: Federal, State, Local not specified - PPO | Admitting: Neurology

## 2012-05-23 ENCOUNTER — Encounter: Payer: Self-pay | Admitting: Neurology

## 2012-05-23 ENCOUNTER — Ambulatory Visit (INDEPENDENT_AMBULATORY_CARE_PROVIDER_SITE_OTHER): Payer: Federal, State, Local not specified - PPO | Admitting: Neurology

## 2012-05-23 VITALS — BP 106/58 | HR 74 | Temp 97.8°F | Resp 12 | Ht 66.0 in | Wt 161.0 lb

## 2012-05-23 DIAGNOSIS — R51 Headache: Secondary | ICD-10-CM

## 2012-05-23 DIAGNOSIS — R519 Headache, unspecified: Secondary | ICD-10-CM

## 2012-05-23 MED ORDER — TOPIRAMATE 25 MG PO TABS: ORAL_TABLET | ORAL | Status: DC

## 2012-05-23 NOTE — Progress Notes (Signed)
Emily Lane he is a working mother of 4with a family history of migraines in her mother. She estimates that her left-sided throbbing temporal headaches that can last for hours started in her 30s.  They can be associated with photophobia and nausea.  She does not have an aura or visual changes typically with these unilateral throbbing headaches.  Initially they would be a couple times a month.  They have increased over time and now she is having them 4 times a week.  If she lays down and takes 4 Advil they often go away.  Sometimes she has to take a second dose 6 hours later.  Issues at work and cannot lay down she tries to work through them and sometimes they go away, but sometimes they persisted and this interferes with her ability to focus and concentrate of her former job at 100%.  He would be rare for her to have to leave work with them.  They probably occur more at work and at other times, but they can also occur on the weekends when she takes her daughter to the ball games and there is a bright sun or she gets dehydrated.  She has never had a head imaging study.  She also has irritable bowel syndrome and she is now vegetarian. She is consider going on a gluten-free diet.  She had tried Imitrex in the past but this wasn't much of a relief and less effective than ibuprofen.  She has not been on a preventative medication.  Review of systems is positive for tension in the neck area , occasionalsleep. Difficulty when under stress, and constipation and diarrhea related to the irritable bowel.  Remainder of review of systems is negative.  Past Medical History  Diagnosis Date  . Migraine   . Asthma   . Hyperthyroidism     Current Outpatient Prescriptions on File Prior to Visit  Medication Sig Dispense Refill  . albuterol (PROVENTIL,VENTOLIN) 90 MCG/ACT inhaler Inhale 2 puffs into the lungs as needed. Shortness of breath        . EPINEPHrine (EPI-PEN) 0.3 mg/0.3 mL DEVI Inject 0.3 mg into the muscle once.       . methimazole (TAPAZOLE) 10 MG tablet Take 1 tablet (10 mg total) by mouth daily.  30 tablet  2   No current facility-administered medications on file prior to visit.   Shellfish allergy and Penicillins  History   Social History  . Marital Status: Married    Spouse Name: N/A    Number of Children: N/A  . Years of Education: N/A   Occupational History  . Not on file.   Social History Main Topics  . Smoking status: Never Smoker   . Smokeless tobacco: Never Used  . Alcohol Use: No     Comment: none  . Drug Use: No  . Sexually Active: Yes    Birth Control/ Protection: None   Other Topics Concern  . Not on file   Social History Narrative  . No narrative on file   No family history on file.   BP 106/58  Pulse 74  Temp(Src) 97.8 F (36.6 C)  Resp 12  Ht 5\' 6"  (1.676 m)  Wt 161 lb (73.029 kg)  BMI 26 kg/m2   Well-developed woman with mild-to-moderate migraine on the left side.  Alert and oriented x 3.  Memory function appears to be intact.  Concentration and attention are normal for educational level and background.  Speech is fluent and without significant word finding  difficulty.  Is aware of current events.  No carotid bruits detected.  Cranial nerve II through XII are within normal limits.  This includes normal optic discs and acuity, EOMI, PERLA, facial movement and sensation intact, hearing grossly intact, gag intact,Uvula raises symmetrically and tongue protrudes evenly. Motor strength is 5 over 5 throughout all limbs.  No atrophy, abnormal tone or tremors. Reflexes are 2+ and symmetric in the upper and lower extremities Sensory exam is intact. Coordination is intact for fine movements and rapid alternating movements in all limbs Gait and station are normal.   She does experience some relief of the headache with acupressure.  Impression: 1. Left-sided temporal throbbing headaches in this patient with a positive family history for migraines in her mother.  This  would be very consistent with migraine headaches.  However, the headaches are exclusively on the left and therefore it is reasonable to do an imaging study to rule out any unexpected findings that could be causing these headaches.  Plan: 1. MRI with and without contrast of the brain 2. Trial of Topamax 25 to 50 mg q.h.s. As migraine preventative. 3. Continue Advil p.r.n. For now 4. Acupuncture referral. 5. Return in 4 weeks for followup in

## 2012-05-23 NOTE — Patient Instructions (Addendum)
Your MRI is scheduled at [facility] on Monday, April 28th at 10:00 am. Please check in at the first floor radiology department 15 minutes prior to your scheduled appointment time. Enter the hospital at the new [address] off of [address].    [phone].  Follow up in one month in our office.

## 2012-05-28 ENCOUNTER — Ambulatory Visit (HOSPITAL_COMMUNITY): Admission: RE | Admit: 2012-05-28 | Payer: Federal, State, Local not specified - PPO | Source: Ambulatory Visit

## 2012-05-29 ENCOUNTER — Ambulatory Visit (HOSPITAL_COMMUNITY): Admission: RE | Admit: 2012-05-29 | Payer: Federal, State, Local not specified - PPO | Source: Ambulatory Visit

## 2012-06-27 ENCOUNTER — Ambulatory Visit: Payer: Federal, State, Local not specified - PPO | Admitting: Neurology

## 2012-07-13 ENCOUNTER — Ambulatory Visit (HOSPITAL_COMMUNITY)
Admission: RE | Admit: 2012-07-13 | Discharge: 2012-07-13 | Disposition: A | Discharge status: Home / Self Care | Payer: Federal, State, Local not specified - PPO | Source: Ambulatory Visit | Attending: Neurology | Admitting: Neurology

## 2012-07-13 ENCOUNTER — Telehealth: Payer: Self-pay | Admitting: Neurology

## 2012-07-13 DIAGNOSIS — R51 Headache: Secondary | ICD-10-CM | POA: Insufficient documentation

## 2012-07-13 MED ORDER — GADOBENATE DIMEGLUMINE 529 MG/ML IV SOLN
15.0000 mL | Freq: Once | INTRAVENOUS | Status: AC | PRN
  Administered 2012-07-13: 15 mL via INTRAVENOUS

## 2012-07-13 NOTE — Telephone Encounter (Signed)
Message copied by Benay Spice on Fri Jul 13, 2012  4:03 PM ------      Message from: [place], [place]      Created: Fri Jul 13, 2012  2:26 PM       Mri is normal            ms      ----- Message -----         From: Rad Results In Interface         Sent: 07/13/2012  11:20 AM           To: Michael L. Smiley Houseman, MD                   ------

## 2012-07-13 NOTE — Telephone Encounter (Signed)
Spoke with the patient. Informed MRI was normal. No additional questions or concerns voiced.

## 2013-08-08 ENCOUNTER — Other Ambulatory Visit: Payer: Self-pay | Admitting: Obstetrics and Gynecology

## 2013-08-08 DIAGNOSIS — R928 Other abnormal and inconclusive findings on diagnostic imaging of breast: Secondary | ICD-10-CM

## 2013-08-15 ENCOUNTER — Ambulatory Visit
Admission: RE | Admit: 2013-08-15 | Discharge: 2013-08-15 | Disposition: A | Discharge status: Home / Self Care | Payer: Federal, State, Local not specified - PPO | Source: Ambulatory Visit | Attending: Obstetrics and Gynecology | Admitting: Obstetrics and Gynecology

## 2013-08-15 DIAGNOSIS — R928 Other abnormal and inconclusive findings on diagnostic imaging of breast: Secondary | ICD-10-CM

## 2015-09-23 DIAGNOSIS — J9801 Acute bronchospasm: Secondary | ICD-10-CM | POA: Diagnosis not present

## 2015-09-23 DIAGNOSIS — J209 Acute bronchitis, unspecified: Secondary | ICD-10-CM | POA: Diagnosis not present

## 2015-09-23 DIAGNOSIS — R042 Hemoptysis: Secondary | ICD-10-CM | POA: Diagnosis not present

## 2015-11-18 IMAGING — MG MM DIAGNOSTIC UNILATERAL L
2 series · 2 of 2 positions shown · non-contrast
Comparison: Baseline mammogram 08/01/2013

CLINICAL DATA: Screening callback for questioned left lower inner
quadrant calcifications at baseline mammography

EXAM:
DIGITAL DIAGNOSTIC  left MAMMOGRAM

[L CC]
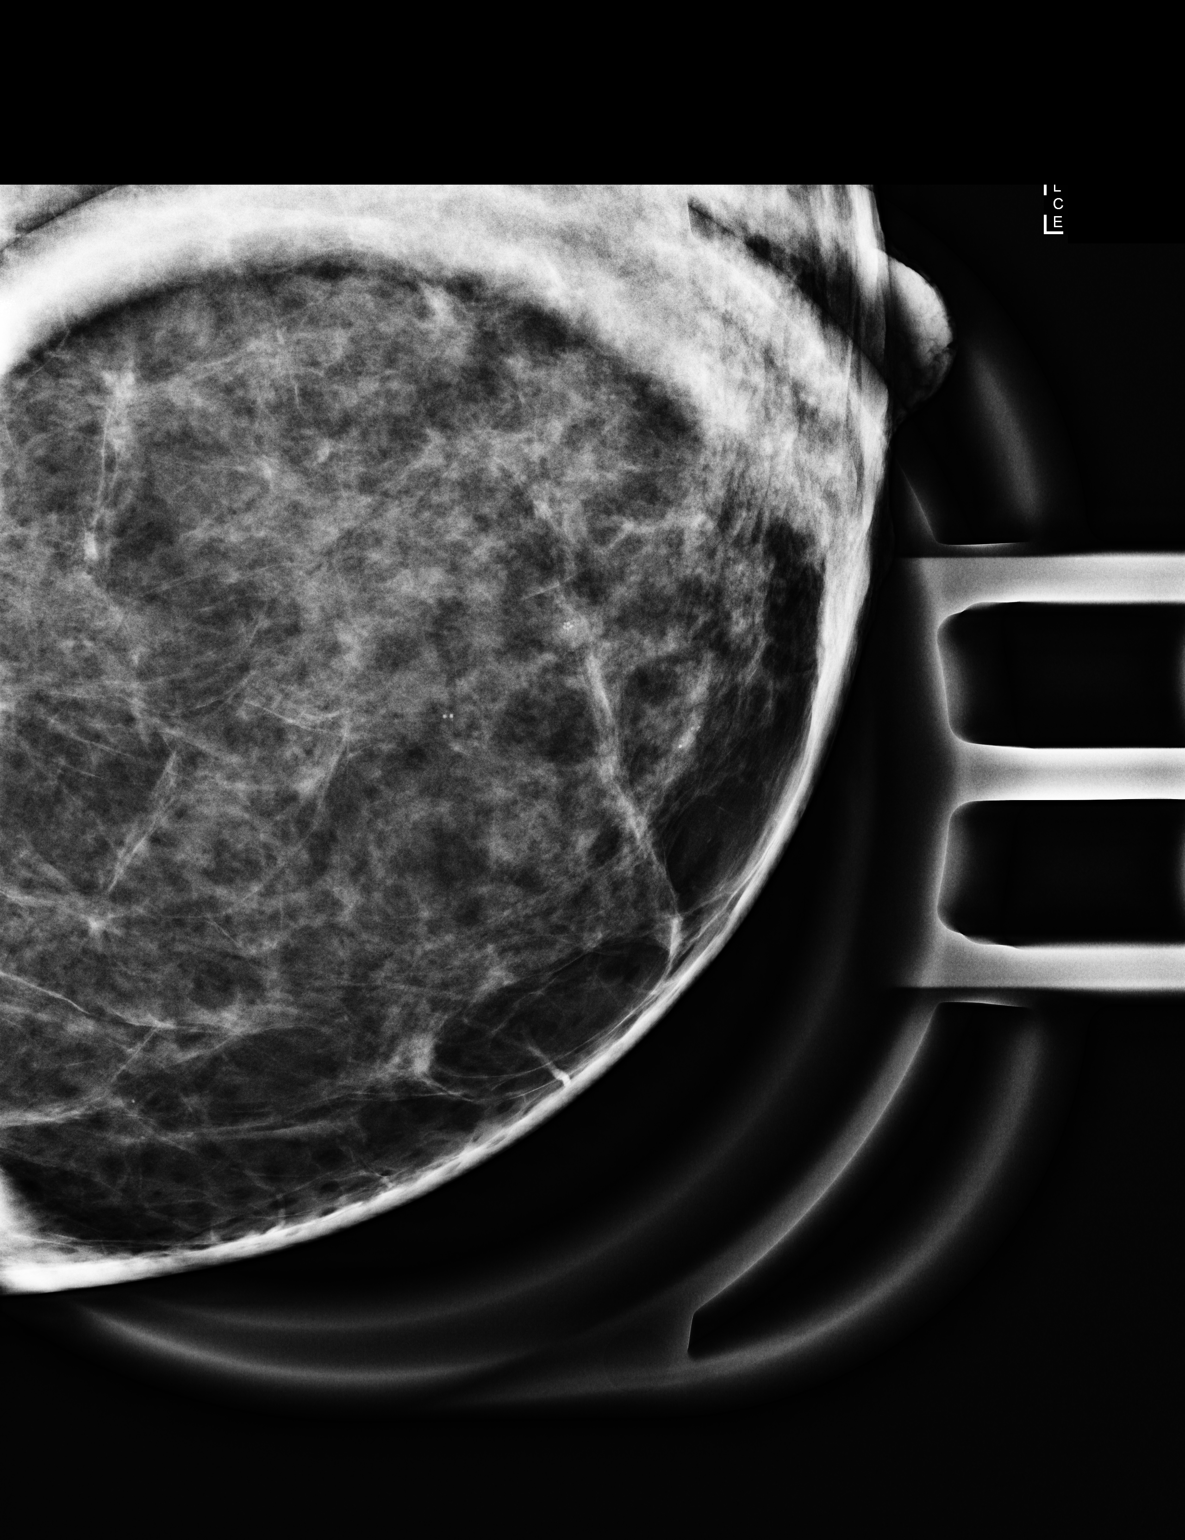

[L ML]
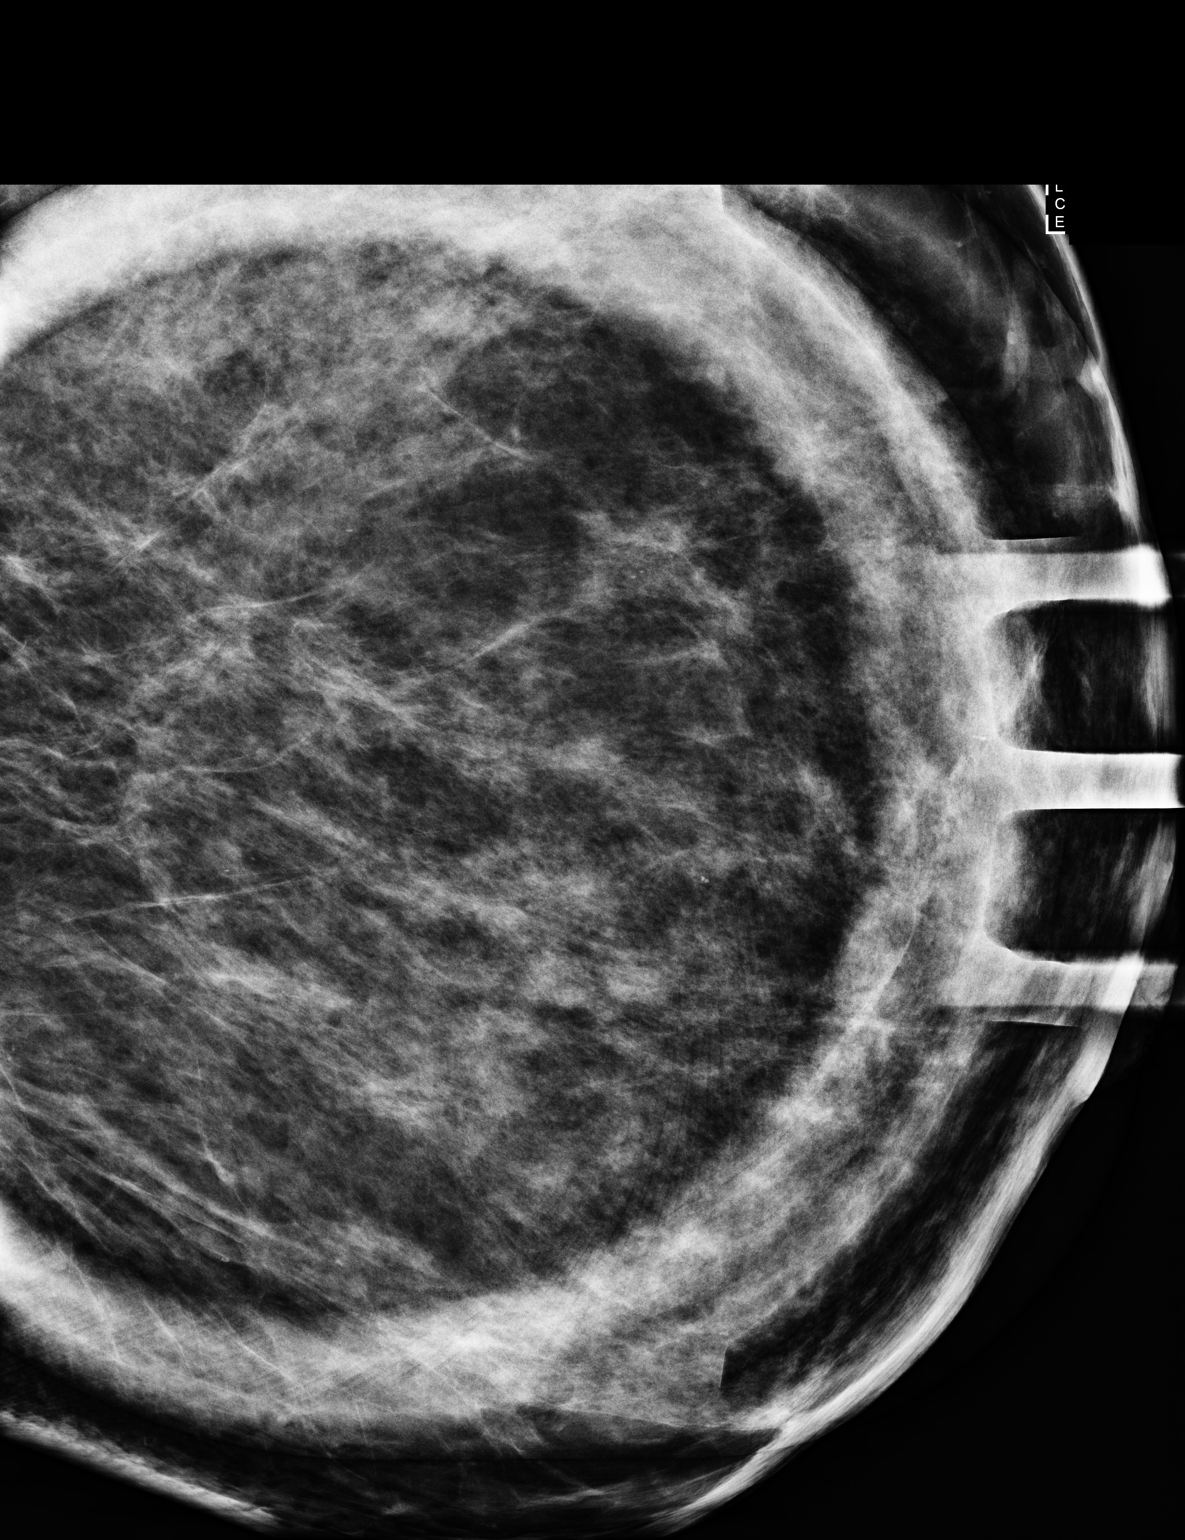

[2 of 2 positions shown; findings below may reference images not displayed]

ACR Breast Density Category c: The breast tissue is heterogeneously
dense, which may obscure small masses.
FINDINGS: Additional views confirm the presence of 2 round calcifications in
the left lower inner quadrant without malignant type morphology or
distribution, corresponding to the questioned screening mammography.
Scattered round calcifications are noted elsewhere in the left
breast.
IMPRESSION: Benign-appearing calcifications, left breast. No further specific
imaging followup is needed for this benign appearing finding.

RECOMMENDATION:
Screening mammogram in one year.(Code:FQ-C-PH1)

I have discussed the findings and recommendations with the patient.
Results were also provided in writing at the conclusion of the
visit. If applicable, a reminder letter will be sent to the patient
regarding the next appointment.

BI-RADS CATEGORY  1: Negative.

## 2015-12-08 DIAGNOSIS — Z1231 Encounter for screening mammogram for malignant neoplasm of breast: Secondary | ICD-10-CM | POA: Diagnosis not present

## 2015-12-16 DIAGNOSIS — Z01419 Encounter for gynecological examination (general) (routine) without abnormal findings: Secondary | ICD-10-CM | POA: Diagnosis not present

## 2015-12-16 DIAGNOSIS — Z6829 Body mass index (BMI) 29.0-29.9, adult: Secondary | ICD-10-CM | POA: Diagnosis not present

## 2015-12-16 DIAGNOSIS — E039 Hypothyroidism, unspecified: Secondary | ICD-10-CM | POA: Diagnosis not present

## 2016-01-01 ENCOUNTER — Ambulatory Visit (INDEPENDENT_AMBULATORY_CARE_PROVIDER_SITE_OTHER): Payer: Federal, State, Local not specified - PPO | Admitting: Internal Medicine

## 2016-01-01 ENCOUNTER — Other Ambulatory Visit (INDEPENDENT_AMBULATORY_CARE_PROVIDER_SITE_OTHER): Payer: Federal, State, Local not specified - PPO

## 2016-01-01 ENCOUNTER — Encounter: Payer: Self-pay | Admitting: Internal Medicine

## 2016-01-01 VITALS — BP 140/83 | HR 83 | Ht 66.0 in | Wt 181.0 lb

## 2016-01-01 DIAGNOSIS — L659 Nonscarring hair loss, unspecified: Secondary | ICD-10-CM

## 2016-01-01 DIAGNOSIS — E0590 Thyrotoxicosis, unspecified without thyrotoxic crisis or storm: Secondary | ICD-10-CM

## 2016-01-01 LAB — T3, FREE: T3, Free: 3.2 pg/mL (ref 2.3–4.2)

## 2016-01-01 LAB — T4, FREE: Free T4: 0.79 ng/dL (ref 0.60–1.60)

## 2016-01-01 LAB — TSH: TSH: 0.33 u[IU]/mL — ABNORMAL LOW (ref 0.35–4.50)

## 2016-01-01 NOTE — Progress Notes (Signed)
Patient ID: Emily Lane, female   DOB: 05/02/1970, 45 y.o.   MRN: [id number]    HPI  Emily Lane is a 45 y.o.-year-old female, referred by Dr. 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 She previously saw Dr. Everardo AllEllison  - last OV 3 years ago.  I reviewed pt's thyroid tests: 12/17/2015: TSH 0.32 (0.40-4.5), free T4 1.1 Lab Results  Component Value Date   TSH 0.05 (L) 04/12/2012   TSH 0.386 12/09/2009   FREET4 0.83 04/12/2012    Thyroid U/S (11/22/2011):  Right thyroid lobe:  4.8 x 1.6 x 1.8 cm. Left thyroid lobe:  4.9 x 1.4 x 1.6 cm. Isthmus:  4 mm in thickness.  Focal nodules:  The echogenicity of the thyroid gland is homogeneous.  Only a tiny hypoechoic structure is noted in the mid left lobe of 3 mm in diameter.  No solid thyroid nodule is seen.  Lymphadenopathy:  None.  Thyroid Uptake and scan (11/22/2011): 24 hour radioiodine uptake calculated at 23%, within the normal range. Thyroid imaging in three projections is normal. No definite focal areas of increased or decreased tracer localization seen. IMPRESSION: Normal exam.  Pt denies feeling nodules in neck, no hoarseness, + dysphagia with pills/no odynophagia, SOB with lying down; she c/o: - + weight gain now, but lost ~20 lbs intentionally at the beginning of the year - + fatigue, + poor sleep - no excessive sweating/heat intolerance - no tremors - no anxiety - no palpitations - no hyperdefecation, + constipation - + hair loss, thinning hair  Pt does not have a FH of thyroid ds. No FH of thyroid cancer. No h/o radiation tx to head or neck.  No seaweed or kelp, no recent contrast studies. No steroid use. No herbal supplements. No Biotin use.  ROS: Constitutional: + see HPI Eyes: no blurry vision, no xerophthalmia ENT: + sore throat, + see HPI Cardiovascular: no CP/+ SOB/no palpitations/leg swelling Respiratory: + cough after her PNA in 2016/+ SOB/+ wheezing (has asthma) Gastrointestinal:  no N/V/D/+ C Musculoskeletal: + muscle/no joint aches Skin: no rashes Neurological: no tremors/numbness/tingling/dizziness, + HA Psychiatric: no depression/anxiety  Past Medical History:  Diagnosis Date  . Asthma   . Hyperthyroidism   . Migraine    History reviewed. No pertinent surgical history. Social History   Social History  . Marital status: Married    Spouse name: N/A  . Number of children: 2   Occupational History  . CMA   Social History Main Topics  . Smoking status: Never Smoker  . Smokeless tobacco: Never Used  . Alcohol use No     Comment: none  . Drug use: No  . Sexual activity: Yes    Birth control/ protection: None   Current Outpatient Prescriptions on File Prior to Visit  Medication Sig Dispense Refill  . albuterol (PROVENTIL,VENTOLIN) 90 MCG/ACT inhaler Inhale 2 puffs into the lungs as needed. Shortness of breath      . EPINEPHrine (EPI-PEN) 0.3 mg/0.3 mL DEVI Inject 0.3 mg into the muscle once.    Marland Kitchen. ibuprofen (ADVIL,MOTRIN) 200 MG tablet Take 200 mg by mouth every 6 (six) hours as needed for pain (takes 4 for migraine).     No current facility-administered medications on file prior to visit.    Allergies  Allergen Reactions  . Shellfish Allergy Anaphylaxis  . Penicillins Hives   Family History  Problem Relation Age of Onset  . Hyperlipidemia Mother   . Diabetes Father   . Hypertension Father    PE:  BP 140/83   Pulse 83   Ht 5\' 6"  (1.676 m)   Wt 181 lb (82.1 kg)   BMI 29.21 kg/m  Wt Readings from Last 3 Encounters:  01/01/16 181 lb (82.1 kg)  05/23/12 161 lb (73 kg)  04/12/12 159 lb (72.1 kg)   Constitutional: overweight, in NAD Eyes: PERRLA, EOMI, no exophthalmos, no lid lag, no stare ENT: moist mucous membranes, no thyromegaly, no thyroid bruits, no cervical lymphadenopathy Cardiovascular: RRR, No MRG Respiratory: CTA B Gastrointestinal: abdomen soft, NT, ND, BS+ Musculoskeletal: no deformities, strength intact in all 4 Skin:  moist, warm, no rashes Neurological: no tremor with outstretched hands, DTR normal in all 4  ASSESSMENT: 1. Thyrotoxicosis  2. Hair loss  PLAN:  1. Patient with a reportedly many years of low TSH, previously investigated with thyroid uptake and scan (normal) and the thyroid ultrasound (also normal) in 2013. Her TSH was as low as 0.05 in 2014, but the most recent TSH was only mildly low at 0.32 earlier this month. She reports several symptoms, but none thyrotoxic: No weight loss, no heat intolerance, no hyperdefecation, no palpitations, no anxiety. She is wondering whether her cough, hoarseness, and dysphagia could be related to her thyroid condition, and I reassured her that this would be highly unlikely in the absence of thyroid nodules or an enlarged thyroid. - she does not appear to have exogenous causes for the low TSH.  - We discussed that possible causes of thyrotoxicosis are:  Graves ds   Thyroiditis toxic multinodular goiter/ toxic adenoma (I cannot feel nodules at palpation of her thyroid and previous ultrasound did not show nodules). - I suggested that we check the TSH, fT3 and fT4 and also add thyroid stimulating antibodies to screen for Graves' disease and Hashimoto's thyroiditis - If the tests are worse, we may need to repeat an uptake and scan to differentiate between the 3 above possible etiologies  - we discussed about possible modalities of treatment for the above conditions, to include methimazole use, radioactive iodine ablation or (last resort) surgery. However, we also discussed that if the TSH is as mildly abnormal as before and her free hormones are normal, no treatment is necessary. - I do not feel that we need to add beta blockers at this time, since she is not tachycardic, anxious, or tremulous - I advised her to join my chart to communicate easier Return in about 6 months (around 07/01/2016).  2. Hair loss - Patient is vegetarian and she is not taking any B12  supplements. I will check a B12 level.  Component     Latest Ref Rng & Units 01/01/2016  TSH     0.35 - 4.50 uIU/mL 0.33 (L)  T4,Free(Direct)     0.60 - 1.60 ng/dL [id number]  Triiodothyronine,Free,Serum     2.3 - 4.2 pg/mL 3.2  Thyroperoxidase Ab SerPl-aCnc     <9 IU/mL 2  Thyroglobulin Ab     <2 IU/mL <1  TSI     <140 % baseline <89   TSH only minimally abnormal >> no intervention needed for now, we can just follow this.  Component     Latest Ref Rng & Units 01/01/2016  Vitamin B12     211 - 911 pg/mL 170 (L)   Vitamin B12 is very low >> would suggest monthly im B12 for 6 months.  Carlus Pavlovristina Ellinore Merced, MD PhD [facility]

## 2016-01-01 NOTE — Patient Instructions (Signed)
Please stop at the lab.  Please come back for a follow-up appointment in 6 months.  

## 2016-01-02 LAB — THYROID PEROXIDASE ANTIBODY: Thyroperoxidase Ab SerPl-aCnc: 2 IU/mL (ref <9)

## 2016-01-02 LAB — THYROGLOBULIN ANTIBODY: Thyroglobulin Ab: <1 IU/mL (ref <2)

## 2016-01-04 LAB — VITAMIN B12: Vitamin B-12: 170 pg/mL — ABNORMAL LOW (ref 211–911)

## 2016-01-06 LAB — THYROID STIMULATING IMMUNOGLOBULIN: TSI: <89 % baseline (ref <140)

## 2016-07-01 ENCOUNTER — Encounter: Payer: Self-pay | Admitting: Internal Medicine

## 2016-07-01 ENCOUNTER — Ambulatory Visit (INDEPENDENT_AMBULATORY_CARE_PROVIDER_SITE_OTHER): Payer: Federal, State, Local not specified - PPO | Admitting: Internal Medicine

## 2016-07-01 ENCOUNTER — Other Ambulatory Visit (INDEPENDENT_AMBULATORY_CARE_PROVIDER_SITE_OTHER): Payer: Federal, State, Local not specified - PPO

## 2016-07-01 VITALS — BP 126/74 | HR 86 | Wt 188.6 lb

## 2016-07-01 DIAGNOSIS — E0590 Thyrotoxicosis, unspecified without thyrotoxic crisis or storm: Secondary | ICD-10-CM

## 2016-07-01 DIAGNOSIS — E538 Deficiency of other specified B group vitamins: Secondary | ICD-10-CM | POA: Diagnosis not present

## 2016-07-01 LAB — TSH: TSH: 0.36 u[IU]/mL (ref 0.35–4.50)

## 2016-07-01 LAB — T3, FREE: T3, Free: 3.3 pg/mL (ref 2.3–4.2)

## 2016-07-01 LAB — T4, FREE: Free T4: 0.83 ng/dL (ref 0.60–1.60)

## 2016-07-01 MED ORDER — CYANOCOBALAMIN 1000 MCG/ML IJ SOLN
1000.0000 ug | Freq: Once | INTRAMUSCULAR | Status: AC
  Administered 2016-07-01: 1000 ug via INTRAMUSCULAR

## 2016-07-01 NOTE — Progress Notes (Signed)
Patient ID: Emily Lane, female   DOB: 01/01/1971, 46 y.o.   MRN: [id number]    HPI  Emily Lane is a 46 y.o.-year-old female,initially  referred by Dr. Zelphia CairoGretchen Adkins, returning for f/u for subclinical thyrotoxicosis and vitamin B12 def. She previously saw Dr. Everardo AllEllison  - last OV 3 years ago. Last visit with me 6 mo ago.  She has a cough for a long time, also sore throat and congestion.  I reviewed pt's thyroid tests: Lab Results  Component Value Date   TSH 0.33 (L) 01/01/2016   TSH 0.05 (L) 04/12/2012   TSH 0.386 12/09/2009   FREET4 0.79 01/01/2016   FREET4 0.83 04/12/2012   12/17/2015: TSH 0.32 (0.40-4.5), free T4 1.1  Component     Latest Ref Rng & Units 01/01/2016  Thyroperoxidase Ab SerPl-aCnc     <9 IU/mL 2  Thyroglobulin Ab     <2 IU/mL <1  TSI     <140 % baseline <89   Reviewed imaging tests: Thyroid U/S (11/22/2011):  Right thyroid lobe:  4.8 x 1.6 x 1.8 cm. Left thyroid lobe:  4.9 x 1.4 x 1.6 cm. Isthmus:  4 mm in thickness.  Focal nodules:  The echogenicity of the thyroid gland is homogeneous.  Only a tiny hypoechoic structure is noted in the mid left lobe of 3 mm in diameter.  No solid thyroid nodule is seen.  Lymphadenopathy:  None.  Thyroid Uptake and scan (11/22/2011): 24 hour radioiodine uptake calculated at 23%, within the normal range. Thyroid imaging in three projections is normal. No definite focal areas of increased or decreased tracer localization seen. IMPRESSION: Normal exam.  Pt denies: - feeling nodules in neck - hoarseness - dysphagia - choking - SOB with lying down  Pt denies: - weight loss - heat intolerance - tremors - palpitations - anxiety - hyperdefecation  But she has: - fatigue - hair loss  Pt does not have a FH of thyroid ds. No FH of thyroid cancer. No h/o radiation tx to head or neck.  No seaweed or kelp. No recent contrast studies. No herbal supplements. No Biotin use. No recent steroids use.   We dx'ed  vitamin B12 def at last visit (pt vegetarian):  Component     Latest Ref Rng & Units 01/01/2016  Vitamin B12     211 - 911 pg/mL 170 (L)   I advised her through MyChart to start im injections of B12 >> received the message but she forgot about it...  ROS: Constitutional: + see HPI Eyes: no blurry vision, no xerophthalmia ENT: + sore throat, + see HPI Cardiovascular: no CP/no SOB/no palpitations/no leg swelling Respiratory: + cough/no SOB/no wheezing Gastrointestinal: no N/no V/no D/no C/+ acid reflux Musculoskeletal: no muscle aches/no joint aches Skin: no rashes, + hair loss Neurological: no tremors/no numbness/no tingling/no dizziness  I reviewed pt's medications, allergies, PMH, social hx, family hx, and changes were documented in the history of present illness. Otherwise, unchanged from my initial visit note.   Past Medical History:  Diagnosis Date  . Asthma   . Hyperthyroidism   . Migraine    No past surgical history on file. Social History   Social History  . Marital status: Married    Spouse name: N/A  . Number of children: 2   Occupational History  . CMA   Social History Main Topics  . Smoking status: Never Smoker  . Smokeless tobacco: Never Used  . Alcohol use No     Comment: none  .  Drug use: No  . Sexual activity: Yes    Birth control/ protection: None   Current Outpatient Prescriptions on File Prior to Visit  Medication Sig Dispense Refill  . albuterol (PROVENTIL,VENTOLIN) 90 MCG/ACT inhaler Inhale 2 puffs into the lungs as needed. Shortness of breath      . EPINEPHrine (EPI-PEN) 0.3 mg/0.3 mL DEVI Inject 0.3 mg into the muscle once.    Marland Kitchen ibuprofen (ADVIL,MOTRIN) 200 MG tablet Take 200 mg by mouth every 6 (six) hours as needed for pain (takes 4 for migraine).     No current facility-administered medications on file prior to visit.    Allergies  Allergen Reactions  . Shellfish Allergy Anaphylaxis  . Penicillins Hives   Family History  Problem  Relation Age of Onset  . Hyperlipidemia Mother   . Diabetes Father   . Hypertension Father    PE: BP 126/74   Pulse 86   Wt 188 lb 9.6 oz (85.5 kg)   SpO2 98%   BMI 30.44 kg/m  Wt Readings from Last 3 Encounters:  07/01/16 188 lb 9.6 oz (85.5 kg)  01/01/16 181 lb (82.1 kg)  05/23/12 161 lb (73 kg)   Constitutional: overweight, in NAD Eyes: PERRLA, EOMI, no exophthalmos ENT: moist mucous membranes, no thyromegaly, no cervical lymphadenopathy Cardiovascular: RRR, No MRG Respiratory: CTA B Gastrointestinal: abdomen soft, NT, ND, BS+ Musculoskeletal: no deformities, strength intact in all 4 Skin: moist, warm, no rashes Neurological: no tremor with outstretched hands, DTR normal in all 4  ASSESSMENT: 1. Thyrotoxicosis  2. B12 def  PLAN:  1. Patient with reportedly many years of low TSH, with a normal Uptake and scan and a normal Thyroid U/S (in 2013). TSH level was as low as 0.05, but at last visit, it was minimally low, at 0.33. The fre thyroid hh are normal. We decided to follow her expectantly, especially since she is asymptomatic. - at last visit, Thyroid Ab's were all normal - she does not appear to have exogenous causes for the low TSH.  - will check a new set of TFTs - If the tests are worse, we may need to repeat an uptake and scan to differentiate between the 3 above possible etiologies  - no beta blockers needed since no tachycardia, tremors - RTC in 6 mo  2. B12 def - B12 level was very low at next visit and I suggested B12 im >> read message but forgot about it - will give her the first im B12 today and then monthly for 6 mo - will repeat level at next visit  Component     Latest Ref Rng & Units 07/01/2016  TSH     0.35 - 4.50 uIU/mL 0.36  T4,Free(Direct)     0.60 - 1.60 ng/dL 1.61  Triiodothyronine,Free,Serum     2.3 - 4.2 pg/mL 3.3  TFTs now normal.  Carlus Pavlov, MD PhD [facility]

## 2016-07-01 NOTE — Patient Instructions (Signed)
[facility]  We will start im B12 injections now >> continue once a month for 6 months.  Please come back for a follow-up appointment in 6 months.

## 2017-01-02 ENCOUNTER — Ambulatory Visit: Payer: Federal, State, Local not specified - PPO | Admitting: Endocrinology

## 2017-01-17 ENCOUNTER — Ambulatory Visit (INDEPENDENT_AMBULATORY_CARE_PROVIDER_SITE_OTHER): Payer: Federal, State, Local not specified - PPO | Admitting: Internal Medicine

## 2017-01-17 ENCOUNTER — Encounter: Payer: Self-pay | Admitting: Internal Medicine

## 2017-01-17 VITALS — BP 140/80 | HR 86 | Ht 66.5 in | Wt 190.8 lb

## 2017-01-17 DIAGNOSIS — E538 Deficiency of other specified B group vitamins: Secondary | ICD-10-CM | POA: Diagnosis not present

## 2017-01-17 DIAGNOSIS — E0590 Thyrotoxicosis, unspecified without thyrotoxic crisis or storm: Secondary | ICD-10-CM

## 2017-01-17 MED ORDER — CYANOCOBALAMIN 1000 MCG/ML IJ SOLN
1000.0000 ug | Freq: Once | INTRAMUSCULAR | Status: AC
  Administered 2017-01-17: 1000 ug via INTRAMUSCULAR

## 2017-01-17 NOTE — Patient Instructions (Addendum)
Please stop at the lab.  Please schedule a nurse visit for B12 injections monthly for the next 6 months.  Please come back for a B12 level 2-4 weeks after the last injection.  Please come back for a follow-up appointment in 1 year.

## 2017-01-17 NOTE — Progress Notes (Signed)
Patient ID: Emily Lane, female   DOB: 02/26/1970, 46 y.o.   MRN: [id number]    HPI  Emily Lane is a 46 y.o.-year-old female,initially  referred by Dr. 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 She previously saw Dr. Everardo Lane  - last OV 3 years ago. Last visit with me 6 months ago.  Subclinical thyrotoxicosis: I reviewed pt's thyroid tests -normal at last check 6 months ago: Lab Results  Component Value Date   TSH 0.36 07/01/2016   TSH 0.33 (L) 01/01/2016   TSH 0.05 (L) 04/12/2012   TSH 0.386 12/09/2009   FREET4 0.83 07/01/2016   FREET4 0.79 01/01/2016   FREET4 0.83 04/12/2012   12/17/2015: TSH 0.32 (0.40-4.5), free T4 1.1  Thyroid antibodies were all normal: Component     Latest Ref Rng & Units 01/01/2016  Thyroperoxidase Ab SerPl-aCnc     <9 IU/mL 2  Thyroglobulin Ab     <2 IU/mL <1  TSI     <140 % baseline <89   Reviewed imaging test reports Thyroid U/S (11/22/2011):  Right thyroid lobe:  4.8 x 1.6 x 1.8 cm. Left thyroid lobe:  4.9 x 1.4 x 1.6 cm. Isthmus:  4 mm in thickness.  Focal nodules:  The echogenicity of the thyroid gland is homogeneous.  Only a tiny hypoechoic structure is noted in the mid left lobe of 3 mm in diameter.  No solid thyroid nodule is seen.  Lymphadenopathy:  None.  Thyroid Uptake and scan (11/22/2011): 24 hour radioiodine uptake calculated at 23%, within the normal range. Thyroid imaging in three projections is normal. No definite focal areas of increased or decreased tracer localization seen. IMPRESSION: Normal exam.  Pt denies: - feeling nodules in neck - dysphagia - choking - SOB with lying down  She has sore throat >> burning; has acid reflux. + hoarseness.  Pt denies: - weight loss, she gained 9 lbs in last year - heat intolerance - tremors - palpitations - anxiety - hyperdefecation - hair loss  Pt does not have a FH of thyroid ds. No FH of  thyroid cancer. No h/o radiation tx to head or neck.  No seaweed or kelp. No recent contrast studies. No herbal supplements. No Biotin use. No recent steroids use.   We diagnosed B12 deficiency a year ago (level was checked as she is a vegetarian): Lab Results  Component Value Date   VITAMINB12 170 (L) 01/01/2016   We  started intramuscular B12  at last visit >> had 2 total.   ROS: Constitutional: no weight gain/no weight loss, no fatigue, no subjective hyperthermia, no subjective hypothermia Eyes: no blurry vision, no xerophthalmia ENT: no sore throat, + see HPI Cardiovascular: no CP/no SOB/no palpitations/no leg swelling Respiratory: no cough/no SOB/no wheezing Gastrointestinal: no N/no V/no D/no C/no acid reflux Musculoskeletal: no muscle aches/no joint aches Skin: no rashes, no hair loss Neurological: no tremors/no numbness/no tingling/no dizziness  I reviewed pt's medications, allergies, PMH, social hx, family hx, and changes were documented in the history of present illness. Otherwise, unchanged from my initial visit note.  Past Medical History:  Diagnosis Date  . Asthma   . Hyperthyroidism   . Migraine    No past surgical history on file. Social History   Social History  . Marital status: Married    Spouse name: N/A  . Number of children: 2   Occupational History  . CMA   Social History Main Topics  .  Smoking status: Never Smoker  . Smokeless tobacco: Never Used  . Alcohol use No     Comment: none  . Drug use: No  . Sexual activity: Yes    Birth control/ protection: None   Current Outpatient Medications on File Prior to Visit  Medication Sig Dispense Refill  . albuterol (PROVENTIL,VENTOLIN) 90 MCG/ACT inhaler Inhale 2 puffs into the lungs as needed. Shortness of breath      . EPINEPHrine (EPI-PEN) 0.3 mg/0.3 mL DEVI Inject 0.3 mg into the muscle once.    Marland Kitchen ibuprofen (ADVIL,MOTRIN) 200 MG tablet Take 200 mg by mouth every 6 (six) hours as needed for pain  (takes 4 for migraine).    . valACYclovir (VALTREX) 1000 MG tablet Take 1 tablet by mouth as needed.  10   No current facility-administered medications on file prior to visit.    Allergies  Allergen Reactions  . Shellfish Allergy Anaphylaxis  . Penicillins Hives   Family History  Problem Relation Age of Onset  . Hyperlipidemia Mother   . Diabetes Father   . Hypertension Father    PE: BP 140/80   Pulse 86   Ht 5' 6.5" (1.689 m)   Wt 190 lb 12.8 oz (86.5 kg)   LMP 12/25/2016   SpO2 99%   BMI 30.33 kg/m  Wt Readings from Last 3 Encounters:  01/17/17 190 lb 12.8 oz (86.5 kg)  07/01/16 188 lb 9.6 oz (85.5 kg)  01/01/16 181 lb (82.1 kg)   Constitutional: overweight, in NAD Eyes: PERRLA, EOMI, no exophthalmos ENT: moist mucous membranes, no thyromegaly, no cervical lymphadenopathy Cardiovascular: RRR, No MRG Respiratory: CTA B Gastrointestinal: abdomen soft, NT, ND, BS+ Musculoskeletal: no deformities, strength intact in all 4 Skin: moist, warm, no rashes Neurological: no tremor with outstretched hands, DTR normal in all 4  ASSESSMENT: 1. Thyrotoxicosis  2. B12 def  PLAN:  1. Patient with reportedly many years of low TSH, with a normal uptake and scan and a normal thyroid ultrasound.  Lowest TSH documented in the chart is 0.05, however, this has improved and she actually had a normal set of tests 6 months ago.  We decided to follow her expectantly, especially since she is asymptomatic - Of note, all her thyroid antibodies were normal - She did not appear to have exogenous causes for the low TSH - At this visit, we will check a new set of TFTs - if the tests are abnormal , we may need to repeat an uptake and scan  - No beta-blockers required since she is not tachycardic or tremulous - I reassured her that her sore throat does not seem to be related to her thyroid condition.  Most likely related to acid reflux. - RTC in 1 year, but will repeat TFTs in 6 mo  2. B12 def -  B12 level was found to be low a year ago.  I sent a message to the patient through my chart to start B12 injections but she did not read it... At last visit, 6 months ago, we gave her the first B12 injection and decided to continue with monthly injections. However, she only had 2. Would like to restart: will give one today and schedule monthly visits for her x 5. - We will check another B12 after the last inj >> will likely be able to switch to po vit B12 then, she refuses to try this for now  Office Visit on 01/17/2017  Component Date Value Ref Range Status  . TSH  01/17/2017 0.39  0.35 - 4.50 uIU/mL Final  . Free T4 01/17/2017 0.80  0.60 - 1.60 ng/dL Final   Comment: Specimens from patients who are undergoing biotin therapy and /or ingesting biotin supplements may contain high levels of biotin.  The higher biotin concentration in these specimens interferes with this Free T4 assay.  Specimens that contain high levels  of biotin may cause false high results for this Free T4 assay.  Please interpret results in light of the total clinical presentation of the patient.    . T3, Free 01/17/2017 3.8  2.3 - 4.2 pg/mL Final   Normal TFTs. This is consistent with our working dx of resolving subacute thyroiditis.  Carlus Pavlovristina Layla Gramm, MD PhD [facility]

## 2017-01-18 LAB — TSH: TSH: 0.39 u[IU]/mL (ref 0.35–4.50)

## 2017-01-18 LAB — T3, FREE: T3, Free: 3.8 pg/mL (ref 2.3–4.2)

## 2017-01-18 LAB — T4, FREE: Free T4: 0.8 ng/dL (ref 0.60–1.60)

## 2017-02-16 ENCOUNTER — Ambulatory Visit (INDEPENDENT_AMBULATORY_CARE_PROVIDER_SITE_OTHER): Payer: Federal, State, Local not specified - PPO

## 2017-02-16 DIAGNOSIS — E538 Deficiency of other specified B group vitamins: Secondary | ICD-10-CM

## 2017-02-16 MED ORDER — CYANOCOBALAMIN 1000 MCG/ML IJ SOLN
1000.0000 ug | Freq: Once | INTRAMUSCULAR | Status: AC
  Administered 2017-02-16: 1000 ug via INTRAMUSCULAR

## 2017-03-06 DIAGNOSIS — Z1231 Encounter for screening mammogram for malignant neoplasm of breast: Secondary | ICD-10-CM | POA: Diagnosis not present

## 2017-03-06 DIAGNOSIS — Z13220 Encounter for screening for lipoid disorders: Secondary | ICD-10-CM | POA: Diagnosis not present

## 2017-03-06 DIAGNOSIS — Z6831 Body mass index (BMI) 31.0-31.9, adult: Secondary | ICD-10-CM | POA: Diagnosis not present

## 2017-03-06 DIAGNOSIS — Z131 Encounter for screening for diabetes mellitus: Secondary | ICD-10-CM | POA: Diagnosis not present

## 2017-03-06 DIAGNOSIS — Z01419 Encounter for gynecological examination (general) (routine) without abnormal findings: Secondary | ICD-10-CM | POA: Diagnosis not present

## 2017-03-06 DIAGNOSIS — Z1321 Encounter for screening for nutritional disorder: Secondary | ICD-10-CM | POA: Diagnosis not present

## 2017-03-06 DIAGNOSIS — Z130 Encounter for screening for diseases of the blood and blood-forming organs and certain disorders involving the immune mechanism: Secondary | ICD-10-CM | POA: Diagnosis not present

## 2017-06-16 DIAGNOSIS — E559 Vitamin D deficiency, unspecified: Secondary | ICD-10-CM | POA: Diagnosis not present

## 2017-06-16 DIAGNOSIS — D649 Anemia, unspecified: Secondary | ICD-10-CM | POA: Diagnosis not present

## 2017-07-18 ENCOUNTER — Ambulatory Visit: Payer: Federal, State, Local not specified - PPO

## 2018-01-03 DIAGNOSIS — R05 Cough: Secondary | ICD-10-CM | POA: Diagnosis not present

## 2018-01-03 DIAGNOSIS — J3089 Other allergic rhinitis: Secondary | ICD-10-CM | POA: Diagnosis not present

## 2018-01-18 ENCOUNTER — Ambulatory Visit (INDEPENDENT_AMBULATORY_CARE_PROVIDER_SITE_OTHER): Payer: Federal, State, Local not specified - PPO | Admitting: Internal Medicine

## 2018-01-18 ENCOUNTER — Encounter: Payer: Self-pay | Admitting: Internal Medicine

## 2018-01-18 VITALS — BP 132/78 | HR 92 | Ht 67.0 in | Wt 189.6 lb

## 2018-01-18 DIAGNOSIS — E0590 Thyrotoxicosis, unspecified without thyrotoxic crisis or storm: Secondary | ICD-10-CM | POA: Diagnosis not present

## 2018-01-18 DIAGNOSIS — E538 Deficiency of other specified B group vitamins: Secondary | ICD-10-CM

## 2018-01-18 LAB — T4, FREE: Free T4: 0.7 ng/dL (ref 0.60–1.60)

## 2018-01-18 LAB — T3, FREE: T3, Free: 3.5 pg/mL (ref 2.3–4.2)

## 2018-01-18 LAB — TSH: TSH: 0.31 u[IU]/mL — ABNORMAL LOW (ref 0.35–4.50)

## 2018-01-18 NOTE — Progress Notes (Signed)
Patient ID: Emily Lane, female   DOB: 08/24/1970, 47 y.o.   MRN: [id number]    HPI  Emily Lane is a 47 y.o.-year-old female,initially  referred by Dr. 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  Last visit 1 year ago.  She continues to experience some fatigue and hair loss.  She stopped getting B12 injections after our last visit.  She was taking multivitamins but not consistently.  He did not take this recently.  Subclinical thyrotoxicosis: TFTs in 4 reviewed and they were normal in the last 2 years: Lab Results  Component Value Date   TSH 0.39 01/17/2017   TSH 0.36 07/01/2016   TSH 0.33 (L) 01/01/2016   TSH 0.05 (L) 04/12/2012   TSH 0.386 12/09/2009   FREET4 0.80 01/17/2017   FREET4 0.83 07/01/2016   FREET4 0.79 01/01/2016   FREET4 0.83 04/12/2012   12/17/2015: TSH 0.32 (0.40-4.5), free T4 1.1  Her thyroid antibodies were normal: Component     Latest Ref Rng & Units 01/01/2016  Thyroperoxidase Ab SerPl-aCnc     <9 IU/mL 2  Thyroglobulin Ab     <2 IU/mL <1  TSI     <140 % baseline <89   Reviewed imaging test reports: Thyroid U/S (11/22/2011):  Right thyroid lobe:  4.8 x 1.6 x 1.8 cm. Left thyroid lobe:  4.9 x 1.4 x 1.6 cm. Isthmus:  4 mm in thickness.  Focal nodules:  The echogenicity of the thyroid gland is homogeneous.  Only a tiny hypoechoic structure is noted in the mid left lobe of 3 mm in diameter.  No solid thyroid nodule is seen.  Lymphadenopathy:  None.  Thyroid Uptake and scan (11/22/2011): 24 hour radioiodine uptake calculated at 23%, within the normal range. Thyroid imaging in three projections is normal. No definite focal areas of increased or decreased tracer localization seen. IMPRESSION: Normal exam.  Pt denies: - feeling nodules in neck - choking - SOB with lying down  She had sore throat, acid reflux >> sensation of burning.  She also has hoarseness and dysphagia  from this.  Pt does not have a FH of thyroid ds. No FH of thyroid cancer. No h/o radiation tx to head or neck.  No seaweed or kelp. No recent contrast studies. No herbal supplements. No Biotin use. No recent steroids use.   B12 deficiency  - dx 2 years ago.  This was checked due to her vegetarian diet.  Reviewed levels: Lab Results  Component Value Date   VITAMINB12 170 (L) 01/01/2016   We started intramuscular B12 injections.  She continues on these.  ROS: Constitutional: no weight gain/no weight loss, + fatigue, no subjective hyperthermia, no subjective hypothermia Eyes: no blurry vision, no xerophthalmia ENT: + Sore throat, + see HPI Cardiovascular: no CP/no SOB/no palpitations/no leg swelling Respiratory: no cough/no SOB/no wheezing Gastrointestinal: no N/no V/no D/+ C/+ acid reflux Musculoskeletal: no muscle aches/no joint aches Skin: no rashes, + hair loss Neurological: no tremors/no numbness/no tingling/no dizziness  I reviewed pt's medications, allergies, PMH, social hx, family hx, and changes were documented in the history of present illness. Otherwise, unchanged from my initial visit note.  Past Medical History:  Diagnosis Date  . Asthma   . Hyperthyroidism   . Migraine    No past surgical history on file. Social History   Social History  . Marital status: Married    Spouse name: N/A  . Number of children: 2  Occupational History  . CMA   Social History Main Topics  . Smoking status: Never Smoker  . Smokeless tobacco: Never Used  . Alcohol use No     Comment: none  . Drug use: No  . Sexual activity: Yes    Birth control/ protection: None   Current Outpatient Medications on File Prior to Visit  Medication Sig Dispense Refill  . albuterol (PROVENTIL,VENTOLIN) 90 MCG/ACT inhaler Inhale 2 puffs into the lungs as needed. Shortness of breath      . EPINEPHrine (EPI-PEN) 0.3 mg/0.3 mL DEVI Inject 0.3 mg into the muscle once.    Marland Kitchen ibuprofen  (ADVIL,MOTRIN) 200 MG tablet Take 200 mg by mouth every 6 (six) hours as needed for pain (takes 4 for migraine).    . valACYclovir (VALTREX) 1000 MG tablet Take 1 tablet by mouth as needed.  10   No current facility-administered medications on file prior to visit.    Allergies  Allergen Reactions  . Shellfish Allergy Anaphylaxis  . Penicillins Hives   Family History  Problem Relation Age of Onset  . Hyperlipidemia Mother   . Diabetes Father   . Hypertension Father    PE: BP 132/78 (BP Location: Right Arm, Patient Position: Sitting, Cuff Size: Normal)   Pulse 92   Ht 5\' 7"  (1.702 m)   Wt 189 lb 9.6 oz (86 kg)   SpO2 94%   BMI 29.70 kg/m  Wt Readings from Last 3 Encounters:  01/18/18 189 lb 9.6 oz (86 kg)  01/17/17 190 lb 12.8 oz (86.5 kg)  07/01/16 188 lb 9.6 oz (85.5 kg)   Constitutional: overweight, in NAD Eyes: PERRLA, EOMI, no exophthalmos ENT: moist mucous membranes, no thyromegaly, no cervical lymphadenopathy Cardiovascular: tachycardia, RR, No MRG Respiratory: CTA B Gastrointestinal: abdomen soft, NT, ND, BS+ Musculoskeletal: no deformities, strength intact in all 4 Skin: moist, warm, no rashes Neurological: no tremor with outstretched hands, DTR normal in all 4  ASSESSMENT: 1. Thyrotoxicosis  2. B12 def  3.  Tachycardia  PLAN:  1. Patient with reportedly many years of low TSH, with abnormal uptake and scan and abnormal thyroid ultrasound.  Low TSH documented in the chart is 0.05, however, this is improved and actually normalized in the last 1.5 years.  We decided to follow her expectantly, especially since she was asymptomatic.  Of note, her thyroid antibodies were normal. No exogenous causes for the low TSH. Our working diagnosis was of resolving subacute thyroiditis -She does not complain of any thyrotoxic symptoms at this visit.  She does have some fatigue and hair loss which can be related to B12 deficiency.  These are not new. -We will recheck her TFTs  today -If the tests are abnormal, we may need to repeat thyroid uptake and scan -I will see her back in a year  2. B12 def -She has a history of low B12 -We started B12 injections  - had 3 inj  - last 1 year ago -she was taking MVI >> stopped -We will check a B12 level today and see if we can switch to p.o. supplementation  3.  Tachycardia -Discussed that this may be correlated with thyroid toxicity -However, she rushed coming here -No tachycardia at the end of the visit.  Component     Latest Ref Rng & Units 01/18/2018  T4,Free(Direct)     0.60 - 1.60 ng/dL 8.29  TSH     5.62 - 1.30 uIU/mL 0.31 (L)  Triiodothyronine,Free,Serum     2.3 -  4.2 pg/mL 3.5  Vitamin B12     211 - 911 pg/mL 240   Mildly low TSH, free thyroid hormones normal. No intervention needed but will repeat her TFTs in 3 mo. B12 low >> Start 5000 mcg B12 daily and recheck in 3 mo to see if we can decrease the dose.  Carlus Pavlovristina Kysean Sweet, MD PhD [facility]

## 2018-01-18 NOTE — Patient Instructions (Signed)
Please stop at the lab.  Please come back for a follow-up appointment in 1 year.  

## 2018-01-19 LAB — VITAMIN B12: Vitamin B-12: 240 pg/mL (ref 211–911)

## 2018-12-31 DIAGNOSIS — Z1231 Encounter for screening mammogram for malignant neoplasm of breast: Secondary | ICD-10-CM | POA: Diagnosis not present

## 2019-01-21 ENCOUNTER — Other Ambulatory Visit: Payer: Self-pay

## 2019-01-21 ENCOUNTER — Ambulatory Visit (INDEPENDENT_AMBULATORY_CARE_PROVIDER_SITE_OTHER): Payer: Federal, State, Local not specified - PPO | Admitting: Internal Medicine

## 2019-01-21 ENCOUNTER — Encounter: Payer: Self-pay | Admitting: Internal Medicine

## 2019-01-21 VITALS — BP 130/78 | HR 92 | Ht 67.0 in | Wt 192.0 lb

## 2019-01-21 DIAGNOSIS — E538 Deficiency of other specified B group vitamins: Secondary | ICD-10-CM

## 2019-01-21 DIAGNOSIS — E0590 Thyrotoxicosis, unspecified without thyrotoxic crisis or storm: Secondary | ICD-10-CM

## 2019-01-21 NOTE — Progress Notes (Signed)
Patient ID: Emily Lane, female   DOB: 12/11/1970, 48 y.o.   MRN: [id number]   This visit occurred during the SARS-CoV-2 public health emergency.  Safety protocols were in place, including screening questions prior to the visit, additional usage of staff PPE, and extensive cleaning of exam room while observing appropriate contact time as indicated for disinfecting solutions.   HPI  Emily Lane is a 48 y.o.-year-old female,initially  referred by Dr. 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  Last visit 1 year ago.  Subclinical thyrotoxicosis: Reviewed her TFTs: Lab Results  Component Value Date   TSH 0.31 (L) 01/18/2018   TSH 0.39 01/17/2017   TSH 0.36 07/01/2016   TSH 0.33 (L) 01/01/2016   TSH 0.05 (L) 04/12/2012   TSH 0.386 12/09/2009   FREET4 0.70 01/18/2018   FREET4 0.80 01/17/2017   FREET4 0.83 07/01/2016   FREET4 0.79 01/01/2016   FREET4 0.83 04/12/2012   12/17/2015: TSH 0.32 (0.40-4.5), free T4 1.1  Her thyroid antibodies were normal: Component     Latest Ref Rng & Units 01/01/2016  Thyroperoxidase Ab SerPl-aCnc     <9 IU/mL 2  Thyroglobulin Ab     <2 IU/mL <1  TSI     <140 % baseline <89   Reviewed imaging test reports: Thyroid U/S (11/22/2011):  Right thyroid lobe:  4.8 x 1.6 x 1.8 cm. Left thyroid lobe:  4.9 x 1.4 x 1.6 cm. Isthmus:  4 mm in thickness.  Focal nodules:  The echogenicity of the thyroid gland is homogeneous.  Only a tiny hypoechoic structure is noted in the mid left lobe of 3 mm in diameter.  No solid thyroid nodule is seen.  Lymphadenopathy:  None.  Thyroid Uptake and scan (11/22/2011): 24 hour radioiodine uptake calculated at 23%, within the normal range. Thyroid imaging in three projections is normal. No definite focal areas of increased or decreased tracer localization seen. IMPRESSION: Normal exam.  Pt denies: - feeling nodules in neck - choking - SOB with  lying down She has GERD which sore throat and hoarseness/dysphagia.  Pt does not have a FH of thyroid ds.No FH of thyroid cancer. No h/o radiation tx to head or neck.  No herbal supplements. No Biotin use. No recent steroids use.   B12 deficiency  - dx in 2017.  This was checked due to a vegetarian diet  Reviewed levels: Lab Results  Component Value Date   VITAMINB12 240 01/18/2018   VITAMINB12 170 (L) 01/01/2016   We started intramuscular B12 injections but at last visit she was off these. We started 5000 mcg Me-B12  -she was on this after our last visit then off - restarted 1 mo ago.  She continues to feel fatigued and still has hair loss, which she feels is worse compared to before.  ROS: Constitutional: no weight gain/no weight loss, + fatigue, no subjective hyperthermia, no subjective hypothermia Eyes: no blurry vision, no xerophthalmia ENT: no sore throat, + see HPI Cardiovascular: no CP/no SOB/no palpitations/no leg swelling Respiratory: no cough/no SOB/no wheezing Gastrointestinal: no N/no V/no D/no C/no acid reflux Musculoskeletal: no muscle aches/no joint aches Skin: no rashes, + more hair loss and thinning lately Neurological: no tremors/no numbness/no tingling/no dizziness  I reviewed pt's medications, allergies, PMH, social hx, family hx, and changes were documented in the history of present illness. Otherwise, unchanged from my initial visit note.  Past Medical History:  Diagnosis Date  . Asthma   .  Hyperthyroidism   . Migraine    No past surgical history on file. Social History   Social History  . Marital status: Married    Spouse name: N/A  . Number of children: 2   Occupational History  . CMA   Social History Main Topics  . Smoking status: Never Smoker  . Smokeless tobacco: Never Used  . Alcohol use No     Comment: none  . Drug use: No  . Sexual activity: Yes    Birth control/ protection: None   Current Outpatient Medications on File Prior  to Visit  Medication Sig Dispense Refill  . albuterol (PROVENTIL,VENTOLIN) 90 MCG/ACT inhaler Inhale 2 puffs into the lungs as needed. Shortness of breath      . EPINEPHrine (EPI-PEN) 0.3 mg/0.3 mL DEVI Inject 0.3 mg into the muscle once.    Marland Kitchen. ibuprofen (ADVIL,MOTRIN) 200 MG tablet Take 200 mg by mouth every 6 (six) hours as needed for pain (takes 4 for migraine).    . valACYclovir (VALTREX) 1000 MG tablet Take 1 tablet by mouth as needed.  10   No current facility-administered medications on file prior to visit.   Allergies  Allergen Reactions  . Shellfish Allergy Anaphylaxis  . Penicillins Hives   Family History  Problem Relation Age of Onset  . Hyperlipidemia Mother   . Diabetes Father   . Hypertension Father    PE: BP 130/78   Pulse 92   Ht 5\' 7"  (1.702 m)   Wt 192 lb (87.1 kg)   SpO2 99%   BMI 30.07 kg/m  Wt Readings from Last 3 Encounters:  01/21/19 192 lb (87.1 kg)  01/18/18 189 lb 9.6 oz (86 kg)  01/17/17 190 lb 12.8 oz (86.5 kg)   Constitutional: overweight, in NAD Eyes: PERRLA, EOMI, no exophthalmos ENT: moist mucous membranes, no thyromegaly, no cervical lymphadenopathy Cardiovascular: tachycardia ,RR, No MRG Respiratory: CTA B Gastrointestinal: abdomen soft, NT, ND, BS+ Musculoskeletal: no deformities, strength intact in all 4 Skin: moist, warm, no rashes Neurological: no tremor with outstretched hands, DTR normal in all 4  ASSESSMENT: 1. Thyrotoxicosis  2. B12 def   PLAN:  1. Patient with reportedly many years of low TSH with abnormal uptake and scan and abnormal thyroid ultrasound.  She has low TSH levels documented in the chart-low 0.05, but this has improved and her TFTs normalized in the recent 2 years.  We decided to follow her expectantly especially since she was asymptomatic.  Of note, her thyroid antibodies were normal.  However, at last visit, TSH returned slightly low, with normal free thyroid hormones.  We decided to repeat the test in 3  months after the visit, however, she did not return for repeat. -No clear thyrotoxic symptoms.  She does have fatigue and hair loss which could be related to B12 deficiency.  These are chronic. -We will recheck her TFTs today -If the results are abnormal, we may need to repeat her thyroid uptake and scan -I will see her back in a year  2. B12 def -She has a history of low vitamin B12 -She has been on B12 injections in the past and then a multivitamin but at last visit she was off supplementation -Vitamin D level returned low at last visit (240) and I advised her to start 5000 mcg B12 daily.  She did so, however, after which she came off.  She just restarted 5000 mcg of B12 approximately a month ago.  Component     Latest Ref  Rng & Units 01/21/2019  T4,Free(Direct)     0.60 - 1.60 ng/dL 0.87  TSH     0.35 - 4.50 uIU/mL 0.22 (L)  Triiodothyronine,Free,Serum     2.3 - 4.2 pg/mL 3.5  Vitamin B12     211 - 911 pg/mL 393   Vitamin B12 is better.  Continue 5000 mcg B12 daily. TSH is slightly lower.  At this point, I would suggest to repeat her thyroid uptake and scan.  Philemon Kingdom, MD PhD [facility]

## 2019-01-21 NOTE — Patient Instructions (Signed)
Please stop at the lab.  Continue B12 5000 mcg daily.  Please come back for a follow-up appointment in 1 year.  

## 2019-01-22 LAB — TSH: TSH: 0.22 u[IU]/mL — ABNORMAL LOW (ref 0.35–4.50)

## 2019-01-22 LAB — VITAMIN B12: Vitamin B-12: 393 pg/mL (ref 211–911)

## 2019-01-22 LAB — T3, FREE: T3, Free: 3.5 pg/mL (ref 2.3–4.2)

## 2019-01-22 LAB — T4, FREE: Free T4: 0.87 ng/dL (ref 0.60–1.60)

## 2019-03-04 ENCOUNTER — Encounter (HOSPITAL_COMMUNITY): Admission: RE | Admit: 2019-03-04 | Payer: Federal, State, Local not specified - PPO | Source: Ambulatory Visit

## 2019-03-05 ENCOUNTER — Encounter (HOSPITAL_COMMUNITY): Payer: Federal, State, Local not specified - PPO

## 2019-04-10 ENCOUNTER — Encounter (HOSPITAL_COMMUNITY): Admission: RE | Admit: 2019-04-10 | Payer: Federal, State, Local not specified - PPO | Source: Ambulatory Visit

## 2019-04-10 ENCOUNTER — Encounter (HOSPITAL_COMMUNITY): Payer: Federal, State, Local not specified - PPO

## 2019-04-10 ENCOUNTER — Telehealth: Payer: Self-pay

## 2019-04-10 NOTE — Telephone Encounter (Signed)
Call from imaging center to let us know patient has NO Show for her NM scan now 2 times.

## 2019-04-11 ENCOUNTER — Encounter (HOSPITAL_COMMUNITY): Payer: Federal, State, Local not specified - PPO

## 2020-01-21 ENCOUNTER — Encounter: Payer: Self-pay | Admitting: Internal Medicine

## 2020-01-21 ENCOUNTER — Other Ambulatory Visit: Payer: Self-pay

## 2020-01-21 ENCOUNTER — Ambulatory Visit: Payer: Federal, State, Local not specified - PPO | Admitting: Internal Medicine

## 2020-01-21 VITALS — BP 140/90 | HR 87 | Ht 67.0 in | Wt 197.6 lb

## 2020-01-21 DIAGNOSIS — E538 Deficiency of other specified B group vitamins: Secondary | ICD-10-CM

## 2020-01-21 DIAGNOSIS — E0590 Thyrotoxicosis, unspecified without thyrotoxic crisis or storm: Secondary | ICD-10-CM | POA: Diagnosis not present

## 2020-01-21 NOTE — Patient Instructions (Signed)
Please stop at the lab.  Continue B12 5000 mcg daily.  Please come back for a follow-up appointment in 1 year.

## 2020-01-21 NOTE — Progress Notes (Signed)
Patient ID: Emily Lane, female   DOB: [birth year], 49 y.o.   MRN: [id number]   This visit occurred during the SARS-CoV-2 public health emergency.  Safety protocols were in place, including screening questions prior to the visit, additional usage of staff PPE, and extensive cleaning of exam room while observing appropriate contact time as indicated for disinfecting solutions.   HPI  Emily Lane is a 49 y.o.-year-old female,initially  referred by Dr. 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  Last visit 1 year ago.  Subclinical thyrotoxicosis:  We reviewed her TFTs: Lab Results  Component Value Date   TSH 0.22 (L) 01/21/2019   TSH 0.31 (L) 01/18/2018   TSH 0.39 01/17/2017   TSH 0.36 07/01/2016   TSH 0.33 (L) 01/01/2016   TSH 0.05 (L) 04/12/2012   TSH 0.386 12/09/2009   FREET4 0.87 01/21/2019   FREET4 0.70 01/18/2018   FREET4 0.80 01/17/2017   FREET4 0.83 07/01/2016   FREET4 0.79 01/01/2016   FREET4 0.83 04/12/2012   12/17/2015: TSH 0.32 (0.40-4.5), free T4 1.1  Her antithyroid antibodies were normal: Component     Latest Ref Rng & Units 01/01/2016  Thyroperoxidase Ab SerPl-aCnc     <9 IU/mL 2  Thyroglobulin Ab     <2 IU/mL <1  TSI     <140 % baseline <89   Reviewed her imaging test reports: Thyroid U/S (11/22/2011):  Right thyroid lobe:  4.8 x 1.6 x 1.8 cm. Left thyroid lobe:  4.9 x 1.4 x 1.6 cm. Isthmus:  4 mm in thickness.  Focal nodules:  The echogenicity of the thyroid gland is homogeneous.  Only a tiny hypoechoic structure is noted in the mid left lobe of 3 mm in diameter.  No solid thyroid nodule is seen.  Lymphadenopathy:  None.  Thyroid Uptake and scan (11/22/2011): 24 hour radioiodine uptake calculated at 23%, within the normal range. Thyroid imaging in three projections is normal. No definite focal areas of increased or decreased tracer localization seen. IMPRESSION: Normal  exam.  Pt denies: - feeling nodules in neck - choking - SOB with lying down But she does have GERD with sore throat and hoarseness/dysphagia.  At last visit, she had fatigue and continued to have hair loss, which she felt was worse.  No FH of thyroid disease or cancer. No h/o radiation tx to head or neck.  No seaweed or kelp. No recent contrast studies. No herbal supplements. No Biotin use. No recent steroids use.   B12 deficiency  -diagnosed in 2017.  This was checked since she is following a vegetarian diet.  Reviewed levels: Lab Results  Component Value Date   VITAMINB12 393 01/21/2019   VITAMINB12 240 01/18/2018   VITAMINB12 170 (L) 01/01/2016   We started intramuscular B12 injections, but she came off of these and then we started 5000 mcg methyl B12.  At last visit, she was off but restarted 1 month prior to the appointment.  We continued the same dose.  ROS: Constitutional: no weight gain/no weight loss, + fatigue, no subjective hyperthermia, no subjective hypothermia Eyes: no blurry vision, no xerophthalmia ENT: no sore throat, no nodules palpated in neck, no dysphagia, no odynophagia, no hoarseness Cardiovascular: no CP/no SOB/no palpitations/no leg swelling Respiratory: no cough/no SOB/no wheezing Gastrointestinal: no N/no V/no D/no C/no acid reflux Musculoskeletal: no muscle aches/no joint aches Skin: no rashes, + hair loss Neurological: no tremors/no numbness/no tingling/no dizziness  I reviewed pt's medications, allergies, PMH,  social hx, family hx, and changes were documented in the history of present illness. Otherwise, unchanged from my initial visit note.  Past Medical History:  Diagnosis Date  . Asthma   . Hyperthyroidism   . Migraine    No past surgical history on file. Social History   Social History  . Marital status: Married    Spouse name: N/A  . Number of children: 2   Occupational History  . CMA   Social History Main Topics  . Smoking  status: Never Smoker  . Smokeless tobacco: Never Used  . Alcohol use No     Comment: none  . Drug use: No  . Sexual activity: Yes    Birth control/ protection: None   Current Outpatient Medications on File Prior to Visit  Medication Sig Dispense Refill  . albuterol (PROVENTIL,VENTOLIN) 90 MCG/ACT inhaler Inhale 2 puffs into the lungs as needed. Shortness of breath      . EPINEPHrine (EPI-PEN) 0.3 mg/0.3 mL DEVI Inject 0.3 mg into the muscle once.    Marland Kitchen ibuprofen (ADVIL,MOTRIN) 200 MG tablet Take 200 mg by mouth every 6 (six) hours as needed for pain (takes 4 for migraine).    . valACYclovir (VALTREX) 1000 MG tablet Take 1 tablet by mouth as needed.  10   No current facility-administered medications on file prior to visit.   Allergies  Allergen Reactions  . Shellfish Allergy Anaphylaxis  . Penicillins Hives   Family History  Problem Relation Age of Onset  . Hyperlipidemia Mother   . Diabetes Father   . Hypertension Father    PE: BP 140/90   Pulse 87   Ht 5\' 7"  (1.702 m)   Wt 197 lb 9.6 oz (89.6 kg)   SpO2 99%   BMI 30.95 kg/m  Wt Readings from Last 3 Encounters:  01/21/20 197 lb 9.6 oz (89.6 kg)  01/21/19 192 lb (87.1 kg)  01/18/18 189 lb 9.6 oz (86 kg)   Constitutional: overweight, in NAD Eyes: PERRLA, EOMI, no exophthalmos ENT: moist mucous membranes, no thyromegaly, no cervical lymphadenopathy Cardiovascular: RRR, No MRG Respiratory: CTA B Gastrointestinal: abdomen soft, NT, ND, BS+ Musculoskeletal: no deformities, strength intact in all 4 Skin: moist, warm, no rashes Neurological: no tremor with outstretched hands, DTR normal in all 4  ASSESSMENT: 1.  Thyrotoxicosis  2. B12 def   PLAN:  1. Patient with reportedly many years of low TSH with a normal uptake and scan and normal thyroid ultrasound.  She had low TSH levels documented in the chart as low as 0.05, but this has improved in the recent years and her TFTs normalized.  We decided to follow her  expectantly especially since she is mostly asymptomatic.  It is possible that a TSH around 0.3 is actually her normal. Of note, her thyroid antibodies were normal. -At last visit, we checked her TFTs and the TSH was still slightly low while the free thyroid hormones were normal.  I suggested a repeat of her thyroid uptake and scan.  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  She has some fatigue and hair loss, which could be related to B12 deficiency and which are chronic. -We will recheck her TFTs today.  We discussed that if the tests are stable, we will just continue to follow them.  If the tests are lower, then we may need to check the  thyroid uptake and scan -I will see her back in 1 year  2. B12 def -She has been on B12 injections in the past, then switched to p.o. B12, at last visit, she was off the B12 for a long period of time but restarted 5000 mcg of B12 approximately 1 month prior to our last appointment. -We checked a B12 level at that time and it was normal so we continued the same dose. -We will recheck the level today  Component     Latest Ref Rng & Units 01/21/2020  T4,Free(Direct)     0.60 - 1.60 ng/dL 3.38  TSH     2.50 - 5.39 uIU/mL 0.43  Triiodothyronine,Free,Serum     2.3 - 4.2 pg/mL 3.2  Vitamin B12     211 - 911 pg/mL 377  Normal B12 and TFTs.  Carlus Pavlov, MD PhD [facility] Endocrinology

## 2020-01-22 LAB — T4, FREE: Free T4: 0.69 ng/dL (ref 0.60–1.60)

## 2020-01-22 LAB — TSH: TSH: 0.43 u[IU]/mL (ref 0.35–4.50)

## 2020-01-22 LAB — T3, FREE: T3, Free: 3.2 pg/mL (ref 2.3–4.2)

## 2020-01-22 LAB — VITAMIN B12: Vitamin B-12: 377 pg/mL (ref 211–911)

## 2020-02-06 DIAGNOSIS — Z20822 Contact with and (suspected) exposure to covid-19: Secondary | ICD-10-CM | POA: Diagnosis not present

## 2020-06-08 DIAGNOSIS — R059 Cough, unspecified: Secondary | ICD-10-CM | POA: Diagnosis not present

## 2020-08-11 DIAGNOSIS — U071 COVID-19: Secondary | ICD-10-CM | POA: Diagnosis not present

## 2020-08-11 DIAGNOSIS — Z20822 Contact with and (suspected) exposure to covid-19: Secondary | ICD-10-CM | POA: Diagnosis not present

## 2020-12-07 DIAGNOSIS — Z1231 Encounter for screening mammogram for malignant neoplasm of breast: Secondary | ICD-10-CM | POA: Diagnosis not present

## 2020-12-14 DIAGNOSIS — N920 Excessive and frequent menstruation with regular cycle: Secondary | ICD-10-CM | POA: Diagnosis not present

## 2020-12-14 DIAGNOSIS — Z01419 Encounter for gynecological examination (general) (routine) without abnormal findings: Secondary | ICD-10-CM | POA: Diagnosis not present

## 2020-12-14 DIAGNOSIS — B009 Herpesviral infection, unspecified: Secondary | ICD-10-CM | POA: Diagnosis not present

## 2020-12-14 DIAGNOSIS — D649 Anemia, unspecified: Secondary | ICD-10-CM | POA: Diagnosis not present

## 2021-01-18 ENCOUNTER — Ambulatory Visit: Payer: Federal, State, Local not specified - PPO | Admitting: Internal Medicine

## 2021-01-18 NOTE — Progress Notes (Deleted)
Patient ID: Emily Lane, female   DOB: [birth year], 50 y.o.   MRN: [id number]   This visit occurred during the SARS-CoV-2 public health emergency.  Safety protocols were in place, including screening questions prior to the visit, additional usage of staff PPE, and extensive cleaning of exam room while observing appropriate contact time as indicated for disinfecting solutions.   HPI  Emily Lane is a 50 y.o.-year-old female,initially  referred by Dr. 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  Last visit 1 year ago.  Interim history: She continues to have some fatigue and hair loss.  Subclinical thyrotoxicosis:  We reviewed her TFTs: Lab Results  Component Value Date   TSH 0.43 01/21/2020   TSH 0.22 (L) 01/21/2019   TSH 0.31 (L) 01/18/2018   TSH 0.39 01/17/2017   TSH 0.36 07/01/2016   TSH 0.33 (L) 01/01/2016   TSH 0.05 (L) 04/12/2012   TSH 0.386 12/09/2009   FREET4 0.69 01/21/2020   FREET4 0.87 01/21/2019   FREET4 0.70 01/18/2018   FREET4 0.80 01/17/2017   FREET4 0.83 07/01/2016   FREET4 0.79 01/01/2016   FREET4 0.83 04/12/2012   12/17/2015: TSH 0.32 (0.40-4.5), free T4 1.1  Her antithyroid antibodies were normal: Component     Latest Ref Rng & Units 01/01/2016  Thyroperoxidase Ab SerPl-aCnc     <9 IU/mL 2  Thyroglobulin Ab     <2 IU/mL <1  TSI     <140 % baseline <89   Reviewed her imaging test reports: Thyroid U/S (11/22/2011):  Right thyroid lobe:  4.8 x 1.6 x 1.8 cm. Left thyroid lobe:  4.9 x 1.4 x 1.6 cm. Isthmus:  4 mm in thickness.   Focal nodules:  The echogenicity of the thyroid gland is homogeneous.  Only a tiny hypoechoic structure is noted in the mid left lobe of 3 mm in diameter.  No solid thyroid nodule is seen.   Lymphadenopathy:  None.   Thyroid Uptake and scan (11/22/2011): 24 hour radioiodine uptake calculated at 23%, within the normal range. Thyroid imaging in three  projections is normal. No definite focal areas of increased or decreased tracer localization seen.  IMPRESSION: Normal exam.   Pt denies: - feeling nodules in neck - choking - SOB with lying down But she does have GERD with sore throat and hoarseness/dysphagia.  No FH of thyroid disease or cancer. No h/o radiation tx to head or neck.  No Biotin use. No recent steroids use.   B12 deficiency  -diagnosed in 2017.  This was checked since she is following a vegetarian diet.  Reviewed levels: Lab Results  Component Value Date   VITAMINB12 377 01/21/2020   VITAMINB12 393 01/21/2019   VITAMINB12 240 01/18/2018   VITAMINB12 170 (L) 01/01/2016   We started intramuscular B12 injections, but she came off of these and then we started 5000 mcg methyl B12.  She continues on 5000 mcg methyl B12 daily.  ROS: + See HPI  + fatigue + hair loss I reviewed pt's medications, allergies, PMH, social hx, family hx, and changes were documented in the history of present illness. Otherwise, unchanged from my initial visit note.  Past Medical History:  Diagnosis Date   Asthma    Hyperthyroidism    Migraine    No past surgical history on file. Social History   Social History   Marital status: Married    Spouse name: N/A   Number of children: 2   Occupational  History   CMA   Social History Main Topics   Smoking status: Never Smoker   Smokeless tobacco: Never Used   Alcohol use No     Comment: none   Drug use: No   Sexual activity: Yes    Birth control/ protection: None   Current Outpatient Medications on File Prior to Visit  Medication Sig Dispense Refill   albuterol (PROVENTIL,VENTOLIN) 90 MCG/ACT inhaler Inhale 2 puffs into the lungs as needed. Shortness of breath     EPINEPHrine (EPI-PEN) 0.3 mg/0.3 mL DEVI Inject 0.3 mg into the muscle once.     ibuprofen (ADVIL,MOTRIN) 200 MG tablet Take 200 mg by mouth every 6 (six) hours as needed for pain (takes 4 for migraine).      valACYclovir (VALTREX) 1000 MG tablet Take 1 tablet by mouth as needed.  10   No current facility-administered medications on file prior to visit.   Allergies  Allergen Reactions   Shellfish Allergy Anaphylaxis   Penicillins Hives   Family History  Problem Relation Age of Onset   Hyperlipidemia Mother    Diabetes Father    Hypertension Father    PE: There were no vitals taken for this visit. Wt Readings from Last 3 Encounters:  01/21/20 197 lb 9.6 oz (89.6 kg)  01/21/19 192 lb (87.1 kg)  01/18/18 189 lb 9.6 oz (86 kg)   Constitutional: overweight, in NAD Eyes: PERRLA, EOMI, no exophthalmos ENT: moist mucous membranes, no thyromegaly, no cervical lymphadenopathy Cardiovascular: RRR, No MRG Respiratory: CTA B Musculoskeletal: no deformities, strength intact in all 4 Skin: moist, warm, no rashes Neurological: no tremor with outstretched hands, DTR normal in all 4  ASSESSMENT: 1.  Thyrotoxicosis  2. B12 def   PLAN:  1. Patient with reportedly many years of low TSH with a normal uptake and scan and the normal thyroid ultrasound.  She had low TSH levels documented in the chart (as low as 0.05), but in the latest years, her TFTs normalized.  We are following her expectantly since she is asymptomatic.  It is possible that her normal baseline is between 0.3-0.4.  Of note, thyroid antibodies were normal. -In the past, I did suggest a repeat thyroid uptake and scan but she no showed for this appointment as she could not take time from work.  Afterwards, his TFTs were normal, I did not suggest again to check this. -She has no clear thyrotoxic symptoms.  She continues to have some fatigue and hair loss, which could be related to B12 deficiency and which are chronic. -At today's visit, we will recheck her TFTs.  If the tests are normal, no intervention needed.  If they start to decrease, we may check a thyroid uptake and scan. -I will see her back in 1 year  2. B12 def -Previously on  B12 injections then switched to p.o. B12 -On 5000 mcg B12 daily -At last visit we rechecked her B12 level and this was normal, at 377 -We will recheck the level today  Carlus Pavlov, MD PhD [facility] Endocrinology

## 2021-06-02 ENCOUNTER — Ambulatory Visit
Admission: EM | Admit: 2021-06-02 | Discharge: 2021-06-02 | Disposition: A | Discharge status: Home / Self Care | Payer: Federal, State, Local not specified - PPO | Attending: Urgent Care | Admitting: Urgent Care

## 2021-06-02 DIAGNOSIS — R070 Pain in throat: Secondary | ICD-10-CM | POA: Diagnosis not present

## 2021-06-02 DIAGNOSIS — R0981 Nasal congestion: Secondary | ICD-10-CM | POA: Diagnosis present

## 2021-06-02 DIAGNOSIS — H9202 Otalgia, left ear: Secondary | ICD-10-CM | POA: Diagnosis present

## 2021-06-02 DIAGNOSIS — Z20818 Contact with and (suspected) exposure to other bacterial communicable diseases: Secondary | ICD-10-CM | POA: Insufficient documentation

## 2021-06-02 DIAGNOSIS — R052 Subacute cough: Secondary | ICD-10-CM | POA: Diagnosis not present

## 2021-06-02 DIAGNOSIS — J069 Acute upper respiratory infection, unspecified: Secondary | ICD-10-CM | POA: Diagnosis not present

## 2021-06-02 LAB — POCT RAPID STREP A (OFFICE): Rapid Strep A Screen: NEGATIVE

## 2021-06-02 MED ORDER — LEVOCETIRIZINE DIHYDROCHLORIDE 5 MG PO TABS: 5.0000 mg | ORAL_TABLET | Freq: Every evening | ORAL | 0 refills | Status: AC

## 2021-06-02 MED ORDER — PSEUDOEPHEDRINE HCL 30 MG PO TABS: 30.0000 mg | ORAL_TABLET | Freq: Three times a day (TID) | ORAL | 0 refills | Status: AC | PRN

## 2021-06-02 MED ORDER — PROMETHAZINE-DM 6.25-15 MG/5ML PO SYRP: 5.0000 mL | ORAL_SOLUTION | Freq: Every evening | ORAL | 0 refills | Status: AC | PRN

## 2021-06-02 NOTE — ED Triage Notes (Signed)
Pt c/o cough, sore throat, nasal congestion, left ear ache,  ? ?Denies headache,  ? ?Onset ~ 4-5 days ago. States has a family member that recently tested strep(+) ?

## 2021-06-02 NOTE — ED Provider Notes (Signed)
?[facility] ? ? ?MRN: [id number] DOB: [birth year] ? ?Subjective:  ? ?Emily Lane is a 51 y.o. female presenting for 4 to 5-day history of acute onset persistent coughing, throat pain, sinus congestion, bilateral ear pain worse on the left side.  No chest pain, shortness of breath or wheezing.  Family member did test positive for strep.  Would like to be checked for this.  She has done COVID testing at home and has been negative.  She is not a smoker.  She does have a history of asthma. ? ?No current facility-administered medications for this encounter. ? ?Current Outpatient Medications:  ?  albuterol (PROVENTIL,VENTOLIN) 90 MCG/ACT inhaler, Inhale 2 puffs into the lungs as needed. Shortness of breath, Disp: , Rfl:  ?  EPINEPHrine (EPI-PEN) 0.3 mg/0.3 mL DEVI, Inject 0.3 mg into the muscle once., Disp: , Rfl:  ?  ibuprofen (ADVIL,MOTRIN) 200 MG tablet, Take 200 mg by mouth every 6 (six) hours as needed for pain (takes 4 for migraine)., Disp: , Rfl:  ?  valACYclovir (VALTREX) 1000 MG tablet, Take 1 tablet by mouth as needed., Disp: , Rfl: 10  ? ?Allergies  ?Allergen Reactions  ? Shellfish Allergy Anaphylaxis  ? Penicillins Hives  ? ? ?Past Medical History:  ?Diagnosis Date  ? Asthma   ? Hyperthyroidism   ? Migraine   ?  ? ?History reviewed. No pertinent surgical history. ? ?Family History  ?Problem Relation Age of Onset  ? Hyperlipidemia Mother   ? Diabetes Father   ? Hypertension Father   ? ? ?Social History  ? ?Tobacco Use  ? Smoking status: Never  ? Smokeless tobacco: Never  ?Substance Use Topics  ? Alcohol use: No  ?  Comment: none  ? Drug use: No  ? ? ?ROS ? ? ?Objective:  ? ?Vitals: ?BP (!) 162/82 (BP Location: Left Arm)   Pulse 88   Temp 98.5 ?F (36.9 ?C) (Oral)   Resp 18   SpO2 98%  ? ?Physical Exam ?Constitutional:   ?   General: She is not in acute distress. ?   Appearance: Normal appearance. She is well-developed and normal weight. She is not ill-appearing, toxic-appearing or  diaphoretic.  ?HENT:  ?   Head: Normocephalic and atraumatic.  ?   Right Ear: Ear canal and external ear normal. No drainage, swelling or tenderness. No middle ear effusion. There is no impacted cerumen. Tympanic membrane is not erythematous.  ?   Left Ear: Ear canal and external ear normal. No drainage, swelling or tenderness.  No middle ear effusion. There is no impacted cerumen. Tympanic membrane is not erythematous.  ?   Ears:  ?   Comments: TMs opacified bilaterally. ?   Nose: Congestion present. No rhinorrhea.  ?   Mouth/Throat:  ?   Mouth: Mucous membranes are moist. No oral lesions.  ?   Pharynx: No pharyngeal swelling, oropharyngeal exudate, posterior oropharyngeal erythema or uvula swelling.  ?   Tonsils: No tonsillar exudate or tonsillar abscesses.  ?Eyes:  ?   General: No scleral icterus.    ?   Right eye: No discharge.     ?   Left eye: No discharge.  ?   Extraocular Movements: Extraocular movements intact.  ?   Right eye: Normal extraocular motion.  ?   Left eye: Normal extraocular motion.  ?   Conjunctiva/sclera: Conjunctivae normal.  ?Cardiovascular:  ?   Rate and Rhythm: Normal rate.  ?   Heart sounds: No murmur heard. ?  No friction rub. No gallop.  ?Pulmonary:  ?   Effort: Pulmonary effort is normal. No respiratory distress.  ?   Breath sounds: No stridor. No wheezing, rhonchi or rales.  ?Chest:  ?   Chest wall: No tenderness.  ?Musculoskeletal:  ?   Cervical back: Normal range of motion and neck supple.  ?Lymphadenopathy:  ?   Cervical: No cervical adenopathy.  ?Skin: ?   General: Skin is warm and dry.  ?Neurological:  ?   General: No focal deficit present.  ?   Mental Status: She is alert and oriented to person, place, and time.  ?Psychiatric:     ?   Mood and Affect: Mood normal.     ?   Behavior: Behavior normal.  ? ? ?Results for orders placed or performed during the hospital encounter of 06/02/21 (from the past 24 hour(s))  ?POCT rapid strep A     Status: None  ? Collection Time: 06/02/21   6:19 PM  ?Result Value Ref Range  ? Rapid Strep A Screen Negative Negative  ? ? ?Assessment and Plan :  ? ?PDMP not reviewed this encounter. ? ?1. Viral upper respiratory infection   ?2. Throat pain   ?3. Strep throat exposure   ?4. Subacute cough   ?5. Sinus congestion   ?6. Left ear pain   ? ? ?Strep culture pending. Deferred imaging given clear cardiopulmonary exam, hemodynamically stable vital signs. Suspect viral URI, viral syndrome. Physical exam findings reassuring and vital signs stable for discharge. Advised supportive care, offered symptomatic relief. Counseled patient on potential for adverse effects with medications prescribed/recommended today, ER and return-to-clinic precautions discussed, patient verbalized understanding.  ? ?  ?Wallis Bamberg, PA-C ?06/02/21 1835 ? ?

## 2021-06-05 LAB — CULTURE, GROUP A STREP (THRC)
